# Patient Record
Sex: Male | Born: 1945 | Race: White | Hispanic: No | Marital: Married | Smoking: Former smoker
Health system: Southern US, Community
[De-identification: ages and names within clinical notes are randomized; demographics above are authoritative.]

## PROBLEM LIST (undated history)

## (undated) DIAGNOSIS — I219 Acute myocardial infarction, unspecified: Secondary | ICD-10-CM

## (undated) DIAGNOSIS — K2270 Barrett's esophagus without dysplasia: Secondary | ICD-10-CM

## (undated) DIAGNOSIS — K219 Gastro-esophageal reflux disease without esophagitis: Secondary | ICD-10-CM

## (undated) DIAGNOSIS — E119 Type 2 diabetes mellitus without complications: Secondary | ICD-10-CM

## (undated) DIAGNOSIS — I2510 Atherosclerotic heart disease of native coronary artery without angina pectoris: Secondary | ICD-10-CM

## (undated) DIAGNOSIS — E785 Hyperlipidemia, unspecified: Secondary | ICD-10-CM

## (undated) DIAGNOSIS — I10 Essential (primary) hypertension: Secondary | ICD-10-CM

## (undated) DIAGNOSIS — R7303 Prediabetes: Secondary | ICD-10-CM

## (undated) HISTORY — DX: Atherosclerotic heart disease of native coronary artery without angina pectoris: I25.10

## (undated) HISTORY — PX: ESOPHAGOGASTRODUODENOSCOPY: SHX1529

## (undated) HISTORY — DX: Essential (primary) hypertension: I10

## (undated) HISTORY — PX: COLONOSCOPY: SHX174

## (undated) HISTORY — PX: NO PAST SURGERIES: SHX2092

---

## 2007-09-20 ENCOUNTER — Ambulatory Visit: Payer: Self-pay | Admitting: Unknown Physician Specialty

## 2010-10-16 ENCOUNTER — Ambulatory Visit: Payer: Self-pay | Admitting: Internal Medicine

## 2017-12-22 ENCOUNTER — Encounter: Payer: Self-pay | Admitting: Emergency Medicine

## 2017-12-22 ENCOUNTER — Inpatient Hospital Stay
Admission: EM | Admit: 2017-12-22 | Discharge: 2017-12-23 | DRG: 287 | Disposition: A | Discharge status: Other Acute Inpatient Hospital | Payer: Medicare HMO | Attending: Internal Medicine | Admitting: Internal Medicine

## 2017-12-22 ENCOUNTER — Other Ambulatory Visit: Payer: Self-pay

## 2017-12-22 ENCOUNTER — Emergency Department: Payer: Medicare HMO

## 2017-12-22 ENCOUNTER — Encounter
Admission: EM | Disposition: A | Discharge status: Other Acute Inpatient Hospital | Payer: Self-pay | Source: Home / Self Care | Attending: Internal Medicine

## 2017-12-22 DIAGNOSIS — I249 Acute ischemic heart disease, unspecified: Secondary | ICD-10-CM | POA: Diagnosis present

## 2017-12-22 DIAGNOSIS — I25110 Atherosclerotic heart disease of native coronary artery with unstable angina pectoris: Secondary | ICD-10-CM | POA: Diagnosis present

## 2017-12-22 DIAGNOSIS — Z823 Family history of stroke: Secondary | ICD-10-CM | POA: Diagnosis not present

## 2017-12-22 DIAGNOSIS — I10 Essential (primary) hypertension: Secondary | ICD-10-CM | POA: Diagnosis present

## 2017-12-22 DIAGNOSIS — E669 Obesity, unspecified: Secondary | ICD-10-CM | POA: Diagnosis present

## 2017-12-22 DIAGNOSIS — E785 Hyperlipidemia, unspecified: Secondary | ICD-10-CM | POA: Diagnosis present

## 2017-12-22 DIAGNOSIS — R079 Chest pain, unspecified: Secondary | ICD-10-CM

## 2017-12-22 DIAGNOSIS — Z6828 Body mass index (BMI) 28.0-28.9, adult: Secondary | ICD-10-CM | POA: Diagnosis not present

## 2017-12-22 DIAGNOSIS — R7303 Prediabetes: Secondary | ICD-10-CM | POA: Diagnosis present

## 2017-12-22 DIAGNOSIS — Z8249 Family history of ischemic heart disease and other diseases of the circulatory system: Secondary | ICD-10-CM | POA: Diagnosis not present

## 2017-12-22 DIAGNOSIS — K219 Gastro-esophageal reflux disease without esophagitis: Secondary | ICD-10-CM | POA: Diagnosis present

## 2017-12-22 HISTORY — DX: Prediabetes: R73.03

## 2017-12-22 HISTORY — DX: Hyperlipidemia, unspecified: E78.5

## 2017-12-22 HISTORY — DX: Gastro-esophageal reflux disease without esophagitis: K21.9

## 2017-12-22 HISTORY — PX: LEFT HEART CATH AND CORONARY ANGIOGRAPHY: CATH118249

## 2017-12-22 LAB — CARDIAC CATHETERIZATION: CATHEFQUANT: 60 %

## 2017-12-22 LAB — GLUCOSE, CAPILLARY
GLUCOSE-CAPILLARY: 112 mg/dL — AB (ref 65–99)
Glucose-Capillary: 107 mg/dL — ABNORMAL HIGH (ref 65–99)

## 2017-12-22 LAB — CBC
HEMATOCRIT: 44.6 % (ref 40.0–52.0)
Hemoglobin: 15.2 g/dL (ref 13.0–18.0)
MCH: 29.3 pg (ref 26.0–34.0)
MCHC: 34 g/dL (ref 32.0–36.0)
MCV: 86.2 fL (ref 80.0–100.0)
Platelets: 168 10*3/uL (ref 150–440)
RBC: 5.18 MIL/uL (ref 4.40–5.90)
RDW: 13.1 % (ref 11.5–14.5)
WBC: 8.8 10*3/uL (ref 3.8–10.6)

## 2017-12-22 LAB — TROPONIN I
TROPONIN I: 0.1 ng/mL — AB (ref <0.03)
Troponin I: 0.07 ng/mL (ref <0.03)
Troponin I: 0.07 ng/mL (ref <0.03)

## 2017-12-22 LAB — BASIC METABOLIC PANEL
Anion gap: 9 (ref 5–15)
BUN: 26 mg/dL — AB (ref 6–20)
CO2: 25 mmol/L (ref 22–32)
Calcium: 8.9 mg/dL (ref 8.9–10.3)
Chloride: 104 mmol/L (ref 101–111)
Creatinine, Ser: 1 mg/dL (ref 0.61–1.24)
GFR calc Af Amer: >60 mL/min (ref >60)
GLUCOSE: 144 mg/dL — AB (ref 65–99)
POTASSIUM: 4.1 mmol/L (ref 3.5–5.1)
Sodium: 138 mmol/L (ref 135–145)

## 2017-12-22 LAB — APTT: APTT: 24 s (ref 24–36)

## 2017-12-22 LAB — HEMOGLOBIN A1C
HEMOGLOBIN A1C: 6.3 % — AB (ref 4.8–5.6)
MEAN PLASMA GLUCOSE: 134.11 mg/dL

## 2017-12-22 LAB — PROTIME-INR
INR: 0.99
Prothrombin Time: 13 seconds (ref 11.4–15.2)

## 2017-12-22 SURGERY — LEFT HEART CATH AND CORONARY ANGIOGRAPHY
Anesthesia: Moderate Sedation

## 2017-12-22 MED ORDER — HEPARIN (PORCINE) IN NACL 1000-0.9 UT/500ML-% IV SOLN
INTRAVENOUS | Status: AC
  Filled 2017-12-22: qty 1000

## 2017-12-22 MED ORDER — ONDANSETRON HCL 4 MG PO TABS: 4.0000 mg | ORAL_TABLET | Freq: Four times a day (QID) | ORAL | Status: DC | PRN

## 2017-12-22 MED ORDER — ASPIRIN 81 MG PO CHEW
324.0000 mg | CHEWABLE_TABLET | Freq: Once | ORAL | Status: DC
  Filled 2017-12-22: qty 4

## 2017-12-22 MED ORDER — ASPIRIN 81 MG PO CHEW: 324.0000 mg | CHEWABLE_TABLET | Freq: Once | ORAL | 0 refills | Status: AC

## 2017-12-22 MED ORDER — ACETAMINOPHEN 325 MG PO TABS
650.0000 mg | ORAL_TABLET | Freq: Four times a day (QID) | ORAL | Status: DC | PRN
  Administered 2017-12-22: 650 mg via ORAL
  Filled 2017-12-22: qty 2

## 2017-12-22 MED ORDER — LIDOCAINE HCL (PF) 1 % IJ SOLN
INTRAMUSCULAR | Status: AC
  Filled 2017-12-22: qty 30

## 2017-12-22 MED ORDER — SODIUM CHLORIDE 0.9% FLUSH: 3.0000 mL | Freq: Two times a day (BID) | INTRAVENOUS | Status: DC

## 2017-12-22 MED ORDER — ASPIRIN 81 MG PO CHEW: 81.0000 mg | CHEWABLE_TABLET | ORAL | Status: DC

## 2017-12-22 MED ORDER — FENTANYL CITRATE (PF) 100 MCG/2ML IJ SOLN
INTRAMUSCULAR | Status: AC
  Filled 2017-12-22: qty 2

## 2017-12-22 MED ORDER — ACETAMINOPHEN 650 MG RE SUPP: 650.0000 mg | Freq: Four times a day (QID) | RECTAL | 0 refills | Status: AC | PRN

## 2017-12-22 MED ORDER — ONDANSETRON HCL 4 MG/2ML IJ SOLN: 4.0000 mg | Freq: Four times a day (QID) | INTRAMUSCULAR | Status: DC | PRN

## 2017-12-22 MED ORDER — HEPARIN (PORCINE) IN NACL 100-0.45 UNIT/ML-% IJ SOLN
1100.0000 [IU]/h | INTRAMUSCULAR | Status: DC
  Administered 2017-12-22 (×2): 1100 [IU]/h via INTRAVENOUS
  Filled 2017-12-22: qty 250

## 2017-12-22 MED ORDER — ONDANSETRON HCL 4 MG/2ML IJ SOLN: 4.0000 mg | Freq: Four times a day (QID) | INTRAMUSCULAR | 0 refills | Status: AC | PRN

## 2017-12-22 MED ORDER — INSULIN ASPART 100 UNIT/ML ~~LOC~~ SOLN: 0.0000 [IU] | Freq: Every day | SUBCUTANEOUS | 11 refills | Status: AC

## 2017-12-22 MED ORDER — SODIUM CHLORIDE 0.9 % IV SOLN: 250.0000 mL | INTRAVENOUS | Status: DC | PRN

## 2017-12-22 MED ORDER — SODIUM CHLORIDE 0.9 % WEIGHT BASED INFUSION: 3.0000 mL/kg/h | INTRAVENOUS | Status: DC

## 2017-12-22 MED ORDER — HEPARIN BOLUS VIA INFUSION
4000.0000 [IU] | Freq: Once | INTRAVENOUS | Status: AC
  Administered 2017-12-22: 4000 [IU] via INTRAVENOUS
  Filled 2017-12-22: qty 4000

## 2017-12-22 MED ORDER — PANTOPRAZOLE SODIUM 40 MG PO TBEC
40.0000 mg | DELAYED_RELEASE_TABLET | Freq: Two times a day (BID) | ORAL | Status: DC
  Administered 2017-12-22: 40 mg via ORAL
  Filled 2017-12-22: qty 1

## 2017-12-22 MED ORDER — ATORVASTATIN CALCIUM 80 MG PO TABS: 80.0000 mg | ORAL_TABLET | Freq: Every day | ORAL | 0 refills | Status: AC

## 2017-12-22 MED ORDER — SODIUM CHLORIDE 0.9% FLUSH
3.0000 mL | Freq: Two times a day (BID) | INTRAVENOUS | Status: DC
  Administered 2017-12-22: 3 mL via INTRAVENOUS

## 2017-12-22 MED ORDER — INSULIN ASPART 100 UNIT/ML ~~LOC~~ SOLN: 0.0000 [IU] | Freq: Three times a day (TID) | SUBCUTANEOUS | Status: DC

## 2017-12-22 MED ORDER — IOPAMIDOL (ISOVUE-300) INJECTION 61%
INTRAVENOUS | Status: DC | PRN
  Administered 2017-12-22: 130 mL via INTRAVENOUS

## 2017-12-22 MED ORDER — INSULIN ASPART 100 UNIT/ML ~~LOC~~ SOLN: 0.0000 [IU] | Freq: Three times a day (TID) | SUBCUTANEOUS | 11 refills | Status: AC

## 2017-12-22 MED ORDER — NITROGLYCERIN 0.4 MG SL SUBL: 0.4000 mg | SUBLINGUAL_TABLET | SUBLINGUAL | 0 refills | Status: AC | PRN

## 2017-12-22 MED ORDER — SODIUM CHLORIDE 0.9% FLUSH: 3.0000 mL | INTRAVENOUS | Status: DC | PRN

## 2017-12-22 MED ORDER — ACETAMINOPHEN 650 MG RE SUPP: 650.0000 mg | Freq: Four times a day (QID) | RECTAL | Status: DC | PRN

## 2017-12-22 MED ORDER — NITROGLYCERIN 0.4 MG SL SUBL: 0.4000 mg | SUBLINGUAL_TABLET | SUBLINGUAL | Status: DC | PRN

## 2017-12-22 MED ORDER — ATORVASTATIN CALCIUM 80 MG PO TABS
80.0000 mg | ORAL_TABLET | Freq: Every day | ORAL | Status: DC
  Administered 2017-12-22: 80 mg via ORAL
  Filled 2017-12-22: qty 4
  Filled 2017-12-22: qty 1

## 2017-12-22 MED ORDER — SODIUM CHLORIDE 0.9 % WEIGHT BASED INFUSION: 1.0000 mL/kg/h | INTRAVENOUS | Status: DC

## 2017-12-22 MED ORDER — MIDAZOLAM HCL 2 MG/2ML IJ SOLN
INTRAMUSCULAR | Status: AC
  Filled 2017-12-22: qty 2

## 2017-12-22 MED ORDER — ASPIRIN EC 81 MG PO TBEC: 81.0000 mg | DELAYED_RELEASE_TABLET | Freq: Every day | ORAL | Status: DC

## 2017-12-22 MED ORDER — PANTOPRAZOLE SODIUM 40 MG PO TBEC: 40.0000 mg | DELAYED_RELEASE_TABLET | Freq: Two times a day (BID) | ORAL | 0 refills | Status: AC

## 2017-12-22 MED ORDER — HEPARIN (PORCINE) IN NACL 100-0.45 UNIT/ML-% IJ SOLN: 1100.0000 [IU]/h | INTRAMUSCULAR | 1 refills | Status: AC

## 2017-12-22 MED ORDER — MIDAZOLAM HCL 2 MG/2ML IJ SOLN
INTRAMUSCULAR | Status: DC | PRN
  Administered 2017-12-22: 1 mg via INTRAVENOUS

## 2017-12-22 MED ORDER — FENTANYL CITRATE (PF) 100 MCG/2ML IJ SOLN
INTRAMUSCULAR | Status: DC | PRN
  Administered 2017-12-22: 25 ug via INTRAVENOUS

## 2017-12-22 MED ORDER — INSULIN ASPART 100 UNIT/ML ~~LOC~~ SOLN: 0.0000 [IU] | Freq: Every day | SUBCUTANEOUS | Status: DC

## 2017-12-22 SURGICAL SUPPLY — 9 items
CATH INFINITI 5FR ANG PIGTAIL (CATHETERS) ×2 IMPLANT
CATH INFINITI 5FR JL4 (CATHETERS) ×2 IMPLANT
CATH INFINITI JR4 5F (CATHETERS) ×2 IMPLANT
DEVICE CLOSURE MYNXGRIP 5F (Vascular Products) ×2 IMPLANT
KIT MANI 3VAL PERCEP (MISCELLANEOUS) ×2 IMPLANT
NEEDLE PERC 18GX7CM (NEEDLE) ×2 IMPLANT
PACK CARDIAC CATH (CUSTOM PROCEDURE TRAY) ×2 IMPLANT
SHEATH AVANTI 5FR X 11CM (SHEATH) ×2 IMPLANT
WIRE GUIDERIGHT .035X150 (WIRE) ×2 IMPLANT

## 2017-12-22 NOTE — Progress Notes (Signed)
Spoke with MD Callwood in regards to restarting Heparin gtt. Per MD, heparin gtt needs to be restarted at the same continuous rate of 11 mL/hr without bolus 6 hours post cardiac catheterization. Per Chart, cardiac catheterization ended at 1647. Therefore I will resume heparin gtt at 2247. Pharmacist notified. Will continue to monitor.  Daniel Good

## 2017-12-22 NOTE — Progress Notes (Signed)
MD callwood called and stated that pt will be transfer to [facility]. Notify primary MD. MD weiting was notifed and on call MD will complete forms. No further orders at this time.

## 2017-12-22 NOTE — Progress Notes (Signed)
ANTICOAGULATION CONSULT NOTE - Follow Up Consult Heparin  Indication:  ACS/NSTEMI   No Known Allergies  Patient Measurements: Height:  (177.8 cm) Weight: 200 lb (90.7 kg) IBW/kg (Calculated) : 73 Heparin Dosing Weight: 90.7 kg  Vital Signs: Temp: 98.6 F (37 C) (05/28 1923) Temp Source: Oral (05/28 1923) BP: 144/91 (05/28 1923) Pulse Rate: 75 (05/28 1923)  Labs: Recent Labs    12/22/17 0504 12/22/17 0940 12/22/17 1326  HGB 15.2  --   --   HCT 44.6  --   --   PLT 168  --   --   APTT  --   --  24  LABPROT  --   --  13.0  INR  --   --  0.99  CREATININE 1.00  --   --   TROPONINI 0.07* 0.10* 0.07*    Estimated Creatinine Clearance: 77.9 mL/min (by C-G formula based on SCr of 1 mg/dL).   Medications:    Assessment:  Goal of Therapy:  HL : 0.3 - 0.7  Check platelets daily    Plan:  Heparin drip started on 5/28 AM but was held this afternoon for cardiac cath.  Heparin drip resumed on 5/28 @ 21:30  Will recheck HL on 5/28 @ 0600.   Lenae Wherley D 12/22/2017,9:37 PM

## 2017-12-22 NOTE — Progress Notes (Signed)
Patient admitted to room 250.

## 2017-12-22 NOTE — Consult Note (Signed)
Reason for Consult: Unstable angina Referring Physician: Dr. Emily Filbert primary Dr. Marquis Lunch hospitalist  Daniel Good is an 72 y.o. male.  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  Patient been seen by his primary physician underwent a exercise stress echo about 6 months ago and has been treated medically the patient started having almost daily chest discomfort and left arm discomfort she came to emergency room for evaluation and subsequently was admitted troponins were borderline elevated so he is being admitted for unstable angina and possible coronary artery disease  Past Medical History:  Diagnosis Date  . GERD (gastroesophageal reflux disease)   . Hyperlipidemia   . Pre-diabetes     Past Surgical History:  Procedure Laterality Date  . NO PAST SURGERIES      Family History  Problem Relation Age of Onset  . CAD Mother   . Heart failure Mother   . CVA Father     Social History:  reports that he has never smoked. He has never used smokeless tobacco. He reports that he drinks alcohol. He reports that he does not use drugs.  Allergies: No Known Allergies  Medications: I have reviewed the patient's current medications.  Results for orders placed or performed during the hospital encounter of 12/22/17 (from the past 48 hour(s))  Basic metabolic panel     Status: Abnormal   Collection Time: 12/22/17  5:04 AM  Result Value Ref Range   Sodium 138 135 - 145 mmol/L   Potassium 4.1 3.5 - 5.1 mmol/L   Chloride 104 101 - 111 mmol/L   CO2 25 22 - 32 mmol/L   Glucose, Bld 144 (H) 65 - 99 mg/dL   BUN 26 (H) 6 - 20 mg/dL   Creatinine, Ser 1.00 0.61 - 1.24 mg/dL   Calcium 8.9 8.9 - 10.3 mg/dL   GFR calc non Af Amer >60 >60 mL/min   GFR calc Af Amer >60 >60 mL/min    Comment:  (NOTE) The eGFR has been calculated using the CKD EPI equation. This calculation has not been validated in all clinical situations. eGFR's persistently <60 mL/min signify possible Chronic Kidney Disease.    Anion gap 9 5 - 15    Comment: Performed at [facility], [address] [place], [place] [address]  CBC     Status: None   Collection Time: 12/22/17  5:04 AM  Result Value Ref Range   WBC 8.8 3.8 - 10.6 K/uL   RBC 5.18 4.40 - 5.90 MIL/uL   Hemoglobin 15.2 13.0 - 18.0 g/dL   HCT 44.6 40.0 - 52.0 %   MCV 86.2 80.0 - 100.0 fL   MCH 29.3 26.0 - 34.0 pg   MCHC 34.0 32.0 - 36.0 g/dL   RDW 13.1 11.5 - 14.5 %   Platelets 168 150 - 440 K/uL    Comment: Performed at [facility], [address] [place], [place] [address]  Troponin I     Status: Abnormal   Collection Time: 12/22/17  5:04 AM  Result Value Ref Range   Troponin I 0.07 (HH) <0.03 ng/mL    Comment: CRITICAL RESULT CALLED TO, READ BACK BY AND VERIFIED WITH  [place] AT [phone] 12/22/17 SDR Performed at [facility], [address] [place], [place] [address]  Troponin I     Status: Abnormal   Collection Time: 12/22/17  9:40 AM  Result Value Ref Range   Troponin I 0.10 (HH) <0.03 ng/mL    Comment: CRITICAL VALUE NOTED. VALUE IS CONSISTENT WITH PREVIOUSLY REPORTED/CALLED VALUE  SNJ/SDR Performed at [facility], [address] [place], [place] [address]   Hemoglobin A1c     Status: Abnormal   Collection Time: 12/22/17  9:40 AM  Result Value Ref Range   Hgb A1c MFr Bld 6.3 (H) 4.8 - 5.6 %    Comment: (NOTE) Pre diabetes:          5.7%-6.4% Diabetes:              >6.4% Glycemic control for   <7.0% adults with diabetes    Mean Plasma Glucose 134.11 mg/dL    Comment: Performed at [facility] [address] [place], [place] [address]    Dg Chest 2 View  Result Date: 12/22/2017 CLINICAL DATA:  Patient woke at 3:30 a.m. with chest pain and diaphoresis. History of  hypertension. EXAM: CHEST - 2 VIEW COMPARISON:  None. FINDINGS: The heart size and mediastinal contours are within normal limits. Both lungs are clear. The visualized skeletal structures are unremarkable. IMPRESSION: No active cardiopulmonary disease. Electronically Signed   By: Lucienne Capers M.D.   On: 12/22/2017 05:27    Review of Systems  Constitutional: Positive for diaphoresis and malaise/fatigue.  HENT: Positive for congestion.   Eyes: Negative.   Respiratory: Positive for shortness of breath.   Cardiovascular: Positive for chest pain, palpitations, orthopnea and PND.  Gastrointestinal: Positive for heartburn.  Genitourinary: Negative.   Musculoskeletal: Positive for joint pain and myalgias.  Skin: Negative.   Neurological: Positive for dizziness.  Endo/Heme/Allergies: Negative.   Psychiatric/Behavioral: Negative.    Blood pressure (!) 150/79, pulse (!) 59, temperature 97.8 F (36.6 C), temperature source Oral, resp. rate 14, height '5\' 10"'$  (1.778 m), weight 200 lb (90.7 kg), SpO2 98 %. Physical Exam  Constitutional: He is oriented to person, place, and time. He appears well-developed and well-nourished.  HENT:  Head: Normocephalic and atraumatic.  Eyes: Pupils are equal, round, and reactive to light. Conjunctivae and EOM are normal.  Neck: Normal range of motion. Neck supple.  Cardiovascular: Normal rate and regular rhythm.  Murmur heard. Respiratory: Effort normal and breath sounds normal.  GI: Soft. Bowel sounds are normal.  Musculoskeletal: Normal range of motion.  Neurological: He is alert and oriented to person, place, and time. He has normal reflexes.  Skin: Skin is warm.    Assessment/Plan: Unstable angina Acute coronary syndrome Hypertension Obesity Chest pain GERD Hyperlipidemia Borderline troponins Borderline diabetes . Plan Agree with admit to telemetry Rule out for myocardial infarction follow-up EKGs and troponins Recommend IV anticoagulation with  heparin Aspirin therapy Consider low-dose beta-blockade as well as ACE inhibitor therapy Continue statin therapy for hyperlipidemia Consider invasive strategy with cardiac cath since he is recently had a functional study Recommend weight loss exercise portion control Continue omeprazole therapy for reflux symptoms   Nessie Nong D Jael Kostick 12/22/2017, 2:00 PM

## 2017-12-22 NOTE — ED Notes (Signed)
Pt is being admitted to 2A room 250. Report was given to Jerrye Bushy., RN and she verbalized understanding of the report given.

## 2017-12-22 NOTE — ED Notes (Signed)
Charge nurse notif pt's troponin 0.07; pt taken to room 9 by EDT Lashea to be placed on card monitor for further monitoring & eval

## 2017-12-22 NOTE — ED Notes (Signed)
Floor unable to take report at this time.

## 2017-12-22 NOTE — ED Notes (Signed)
ED Provider at bedside. 

## 2017-12-22 NOTE — Progress Notes (Signed)
   12/22/17 1900  Clinical Encounter Type  Visited With Patient;Patient and family together  Visit Type Initial;Other (Comment) (HCPOA)  Referral From Nurse  Spiritual Encounters  Spiritual Needs Emotional;Literature  HCPOA/AD materials dropped off with patient.  CH reviewed materials briefly, discussed process; offered additional spiritual and emotional support

## 2017-12-22 NOTE — H&P (Signed)
[facility] [place] [facility]   PATIENT NAME: Daniel Good    MR#:  [id number]  DATE OF BIRTH:  07-03-1946  DATE OF ADMISSION:  12/22/2017  PRIMARY CARE PHYSICIAN: Danella Penton, MD   REQUESTING/REFERRING PHYSICIAN: Dr Lucrezia Europe  CHIEF COMPLAINT:   Chief Complaint  Patient presents with  . Chest Pain    HISTORY OF PRESENT ILLNESS:  Daniel Good  is a 72 y.o. male with a known history of prediabetes, hyperlipidemia presents to the ER with chest pain.  This chest pain woke him up at 3:30 AM.  Severe in intensity, at least 6 out of 10 in intensity in the center of his chest.  It lasted 10 minutes then eased off.  No shortness of breath or nausea vomiting.  Did have pain in his left arm and jaw and sweating.  Over the last week he has been having on and off chest pain lasting a few minutes at a time.  Can happen at any time at rest or with exercise.  He did have a normal stress echo back in 2017 with Dr. Hyacinth Meeker.  Hospitalist services were contacted for further evaluation with first troponin borderline at 0.07.  PAST MEDICAL HISTORY:   Past Medical History:  Diagnosis Date  . GERD (gastroesophageal reflux disease)   . Hyperlipidemia   . Pre-diabetes     PAST SURGICAL HISTORY:   Past Surgical History:  Procedure Laterality Date  . NO PAST SURGERIES      SOCIAL HISTORY:   Social History   Tobacco Use  . Smoking status: Never Smoker  . Smokeless tobacco: Never Used  Substance Use Topics  . Alcohol use: Yes    Comment: 2-3 drinks per week    FAMILY HISTORY:   Family History  Problem Relation Age of Onset  . CAD Mother   . Heart failure Mother   . CVA Father     DRUG ALLERGIES:  No Known Allergies  REVIEW OF SYSTEMS:  CONSTITUTIONAL: No fever, fatigue or weakness.  Positive for sweating EYES: No blurred or double vision.  Wears glasses. EARS, NOSE, AND THROAT: No tinnitus or ear pain. No sore throat RESPIRATORY: No cough,  shortness of breath, wheezing or hemoptysis.  CARDIOVASCULAR: Positive no chest pain, orthopnea, edema.  GASTROINTESTINAL: No nausea, vomiting, diarrhea or abdominal pain. No blood in bowel movements GENITOURINARY: No dysuria, hematuria.  ENDOCRINE: No polyuria, nocturia,  HEMATOLOGY: No anemia, easy bruising or bleeding SKIN: No rash or lesion. MUSCULOSKELETAL: Left arm pain NEUROLOGIC: No tingling, numbness, weakness.  PSYCHIATRY: No anxiety or depression.   MEDICATIONS AT HOME:   Prior to Admission medications   Medication Sig Start Date End Date Taking? Authorizing Provider  atorvastatin (LIPITOR) 80 MG tablet Take 40 tablets by mouth daily.  08/18/16  Yes [provider]  omeprazole (PRILOSEC OTC) 20 MG tablet Take 1 capsule by mouth 2 (two) times daily. 04/22/17  Yes [provider]  aspirin EC 81 MG tablet Take 1 tablet by mouth daily.    [provider]      VITAL SIGNS:  Blood pressure 130/71, pulse (!) 58, temperature 98 F (36.7 C), temperature source Oral, resp. rate 12, height  (1.778 m), weight 90.7 kg (200 lb), SpO2 95 %.  PHYSICAL EXAMINATION:  GENERAL:  72 y.o.-year-old patient lying in the bed with no acute distress.  EYES: Pupils equal, round, reactive to light and accommodation. No scleral icterus. Extraocular muscles intact.  HEENT: Head atraumatic,  normocephalic. Oropharynx and nasopharynx clear.  NECK:  Supple, no jugular venous distention. No thyroid enlargement, no tenderness.  LUNGS: Normal breath sounds bilaterally, no wheezing, rales,rhonchi or crepitation. No use of accessory muscles of respiration.  CARDIOVASCULAR: S1, S2 normal. No murmurs, rubs, or gallops.  ABDOMEN: Soft, nontender, nondistended. Bowel sounds present. No organomegaly or mass.  EXTREMITIES: No pedal edema, cyanosis, or clubbing.  NEUROLOGIC: Cranial nerves II through XII are intact. Muscle strength 5/5 in all extremities. Sensation intact. Gait not  checked.  PSYCHIATRIC: The patient is alert and oriented x 3.  SKIN: No rash, lesion, or ulcer.   LABORATORY PANEL:   CBC Recent Labs  Lab 12/22/17 0504  WBC 8.8  HGB 15.2  HCT 44.6  PLT 168   ------------------------------------------------------------------------------------------------------------------  Chemistries  Recent Labs  Lab 12/22/17 0504  NA 138  K 4.1  CL 104  CO2 25  GLUCOSE 144*  BUN 26*  CREATININE 1.00  CALCIUM 8.9   ------------------------------------------------------------------------------------------------------------------  Cardiac Enzymes Recent Labs  Lab 12/22/17 0504  TROPONINI 0.07*   ------------------------------------------------------------------------------------------------------------------  RADIOLOGY:  Dg Chest 2 View  Result Date: 12/22/2017 CLINICAL DATA:  Patient woke at 3:30 a.m. with chest pain and diaphoresis. History of hypertension. EXAM: CHEST - 2 VIEW COMPARISON:  None. FINDINGS: The heart size and mediastinal contours are within normal limits. Both lungs are clear. The visualized skeletal structures are unremarkable. IMPRESSION: No active cardiopulmonary disease. Electronically Signed   By: Burman Nieves M.D.   On: 12/22/2017 05:27    EKG:   Sinus bradycardia 59 bpm no acute ST-T wave changes  IMPRESSION AND PLAN:   1.  Chest pain concerning for acute coronary syndrome.  The patient having chest pain on and off for the past week.  Worse episode this morning.  Aspirin, heparin drip and Lipitor.  Beta-blocker contraindicated with relative bradycardia at this point.  Cardiology consultation.  Serial cardiac enzymes and monitor on telemetry. 2.  Prediabetes check hemoglobin A1c put on sliding scale 3.  Hyperlipidemia unspecified put on Lipitor and check a lipid profile tomorrow morning 4.  GERD on PPI   All the records are reviewed and case discussed with ED provider. Management plans discussed with the patient,  family and they are in agreement.  CODE STATUS: Full code  TOTAL TIME TAKING CARE OF THIS PATIENT: 50 minutes.  Case discussed with Dr. Juliann Pares cardiology.   Alford Highland M.D on 12/22/2017 at 8:20 AM  Between 7am to 6pm - Pager - [phone]  After 6pm call admission pager [phone]  [facility]  [phone]  CC: Primary care physician; Danella Penton, MD

## 2017-12-22 NOTE — ED Notes (Signed)
Admitting Provider at bedside. 

## 2017-12-22 NOTE — ED Triage Notes (Signed)
Patient ambulatory to triage with steady gait, without difficulty or distress noted; pt reports earlier episode of CP (awoke at 330am diaphoretic) relieved by taking 2 ASA; pt denies any c/o at present but would like to be examined; st hx of same with HTN

## 2017-12-22 NOTE — ED Provider Notes (Signed)
[facility] Emergency Department Provider Note   ____________________________________________   First MD Initiated Contact with Patient 12/22/17 [phone]     (approximate)  I have reviewed the triage vital signs and the nursing notes.   HISTORY  Chief Complaint Chest Pain    HPI CHANDLOR NOECKER is a 72 y.o. male who comes into the hospital today with some chest pain.  He reports that it woke him up at 3:30 in the morning.  The patient states that it was mid chest intense pain in the center of his chest and going down his left arm.  The patient states that he was sweating but he denies any nausea vomiting or shortness of breath.  The patient has had similar pain previously.  He states that he had every day for 2 weeks and thought it was may be some nerve pain.  The patient took some aspirin and reports that his pain currently is a 0 out of 10 in intensity.  He is here for evaluation of this pain.  The patient states that he had a stress test about 3 to 6 months ago.   Past Medical History:  Diagnosis Date  . GERD (gastroesophageal reflux disease)   . Hyperlipidemia   . Pre-diabetes     There are no active problems to display for this patient.   History reviewed. No pertinent surgical history.  Prior to Admission medications   Not on File    Allergies Patient has no known allergies.  No family history on file.  Social History Social History   Tobacco Use  . Smoking status: Never Smoker  . Smokeless tobacco: Never Used  Substance Use Topics  . Alcohol use: Yes  . Drug use: Never    Review of Systems  Constitutional: sweats Eyes: No visual changes. ENT: No sore throat. Cardiovascular: chest pain. Respiratory: Denies shortness of breath. Gastrointestinal: No abdominal pain.  No nausea, no vomiting.   Genitourinary: Negative for dysuria. Musculoskeletal: Negative for back pain. Skin: Negative for rash. Neurological: Negative for  headaches.   ____________________________________________   PHYSICAL EXAM:  VITAL SIGNS: ED Triage Vitals  Enc Vitals Group     BP 12/22/17 0505 (!) 147/79     Pulse Rate 12/22/17 0505 63     Resp 12/22/17 0505 18     Temp 12/22/17 0505 98 F (36.7 C)     Temp Source 12/22/17 0505 Oral     SpO2 12/22/17 0505 95 %     Weight 12/22/17 0446 200 lb (90.7 kg)     Height 12/22/17 0446  (1.778 m)     Head Circumference --      Peak Flow --      Pain Score 12/22/17 0445 0     Pain Loc --      Pain Edu? --      Excl. in GC? --     Constitutional: Alert and oriented. Well appearing and in mild distress. Eyes: Conjunctivae are normal. PERRL. EOMI. Head: Atraumatic. Nose: No congestion/rhinnorhea. Mouth/Throat: Mucous membranes are moist.  Oropharynx non-erythematous. Cardiovascular: Normal rate, regular rhythm. Grossly normal heart sounds.  Good peripheral circulation. Respiratory: Normal respiratory effort.  No retractions. Lungs CTAB. Gastrointestinal: Soft and nontender. No distention. Positive bowel sounds Musculoskeletal: No lower extremity tenderness nor edema.   Neurologic:  Normal speech and language.  Skin:  Skin is warm, dry and intact.  Psychiatric: Mood and affect are normal.   ____________________________________________   LABS (all labs  ordered are listed, but only abnormal results are displayed)  Labs Reviewed  BASIC METABOLIC PANEL - Abnormal; Notable for the following components:      Result Value   Glucose, Bld 144 (*)    BUN 26 (*)    All other components within normal limits  TROPONIN I - Abnormal; Notable for the following components:   Troponin I 0.07 (*)    All other components within normal limits  CBC   ____________________________________________  EKG  ED ECG REPORT I, Rebecka Apley, the attending physician, personally viewed and interpreted this ECG.   Date: 12/22/2017  EKG Time: 0458  Rate: 59  Rhythm: Sinus bradycardia   Axis: normal  Intervals:none  ST&T Change: none  ____________________________________________  RADIOLOGY  ED MD interpretation: Chest x-ray: No active cardiopulmonary disease  Official radiology report(s): Dg Chest 2 View  Result Date: 12/22/2017 CLINICAL DATA:  Patient woke at 3:30 a.m. with chest pain and diaphoresis. History of hypertension. EXAM: CHEST - 2 VIEW COMPARISON:  None. FINDINGS: The heart size and mediastinal contours are within normal limits. Both lungs are clear. The visualized skeletal structures are unremarkable. IMPRESSION: No active cardiopulmonary disease. Electronically Signed   By: Burman Nieves M.D.   On: 12/22/2017 05:27    ____________________________________________   PROCEDURES  Procedure(s) performed: None  Procedures  Critical Care performed: No  ____________________________________________   INITIAL IMPRESSION / ASSESSMENT AND PLAN / ED COURSE  As part of my medical decision making, I reviewed the following data within the electronic MEDICAL RECORD NUMBER Notes from prior ED visits and [place] Controlled Substance Database   This is a 72 year old male who comes into the hospital today with some chest pain.  The pain woke him up out of sleep and it was in the middle of his chest with some radiation and some sweats.  My differential diagnosis includes acute coronary syndrome, pneumonia, musculoskeletal pain, pleural effusion  We did check some blood work on the patient to include a CBC BMP troponin and a chest x-ray.  The patient's pain is improved at this time.  He did take 2 aspirin at home.  The patient's blood work is unremarkable aside from a troponin of 0.07.  The patient's chest x-ray is also negative.  I will consider admitting the patient given his story as well as his troponin.  He will be reassessed.  I did contact Dr. Allena Katz who will admit the patient to the hospitalist service.       ____________________________________________   FINAL CLINICAL IMPRESSION(S) / ED DIAGNOSES  Final diagnoses:  Chest pain, unspecified type     ED Discharge Orders    None       Note:  This document was prepared using Dragon voice recognition software and may include unintentional dictation errors.    Rebecka Apley, MD 12/22/17 [phone]

## 2017-12-22 NOTE — Consult Note (Signed)
ANTICOAGULATION CONSULT NOTE - Initial Consult  Pharmacy Consult for heparin drip Indication: chest pain/ACS  No Known Allergies  Patient Measurements: Height:  (177.8 cm) Weight: 200 lb (90.7 kg) IBW/kg (Calculated) : 73 Heparin Dosing Weight: 90.7kg  Vital Signs: Temp: 97.8 F (36.6 C) (05/28 0931) Temp Source: Oral (05/28 0931) BP: 150/79 (05/28 0931) Pulse Rate: 59 (05/28 0931)  Labs: Recent Labs    12/22/17 0504  HGB 15.2  HCT 44.6  PLT 168  CREATININE 1.00  TROPONINI 0.07*    Estimated Creatinine Clearance: 77.9 mL/min (by C-G formula based on SCr of 1 mg/dL).   Medical History: Past Medical History:  Diagnosis Date  . GERD (gastroesophageal reflux disease)   . Hyperlipidemia   . Pre-diabetes     Medications:  Scheduled:  . aspirin  324 mg Oral Once  . [START ON 12/23/2017] aspirin EC  81 mg Oral Daily  . atorvastatin  80 mg Oral q1800  . heparin  4,000 Units Intravenous Once  . insulin aspart  0-5 Units Subcutaneous QHS  . insulin aspart  0-9 Units Subcutaneous TID WC  . pantoprazole  40 mg Oral BID    Assessment: Patient is a 72 year old male who presents to the ED with chest pain and slightly elevated troponin. Pt is not on any anticoagulation PTA. Pharmacy consulted to dose heparin drip. Baseline CBC WNL. Add on INR and APTT ordered.   Goal of Therapy:  Heparin level 0.3-0.7 units/ml Monitor platelets by anticoagulation protocol: Yes   Plan:  Give 4000 units bolus x 1 Start heparin infusion at 1100 units/hr Check anti-Xa level in 6 hours and daily while on heparin Continue to monitor H&H and platelets  Cynthia Stainback D Josearmando Kuhnert, Pharm.D, BCPS Clinical Pharmacist  12/22/2017,9:42 AM

## 2017-12-23 ENCOUNTER — Ambulatory Visit (HOSPITAL_COMMUNITY)
Admission: AD | Admit: 2017-12-23 | Discharge: 2017-12-23 | Disposition: A | Discharge status: Home / Self Care | Payer: Medicare HMO | Source: Other Acute Inpatient Hospital | Attending: Cardiology | Admitting: Cardiology

## 2017-12-23 ENCOUNTER — Encounter: Payer: Self-pay | Admitting: Internal Medicine

## 2017-12-23 DIAGNOSIS — I249 Acute ischemic heart disease, unspecified: Secondary | ICD-10-CM | POA: Insufficient documentation

## 2017-12-23 LAB — HIV ANTIBODY (ROUTINE TESTING W REFLEX): HIV Screen 4th Generation wRfx: NONREACTIVE

## 2017-12-23 NOTE — Discharge Summary (Signed)
[facility] - [place] at [facility]   PATIENT NAME: Daniel Good    MR#:  [id number]  DATE OF BIRTH:  [birth year]  DATE OF ADMISSION:  12/22/2017 ADMITTING PHYSICIAN: Alford Highland, MD  DATE OF DISCHARGE: 12/23/2017  2:00 AM  PRIMARY CARE PHYSICIAN: Danella Penton, MD    ADMISSION DIAGNOSIS:  Chest pain, unspecified type [R07.9]  DISCHARGE DIAGNOSIS:  Active Problems:   ACS (acute coronary syndrome) (HCC)   SECONDARY DIAGNOSIS:   Past Medical History:  Diagnosis Date  . GERD (gastroesophageal reflux disease)   . Hyperlipidemia   . Pre-diabetes     HOSPITAL COURSE:   1.  Chest pain and borderline troponin concerning for acute coronary syndrome.  The patient was admitted to the hospital.  He was given aspirin and heparin drip.  He was kept on his atorvastatin.  Relative bradycardia made beta-blocker contraindicated right at this point.  I contacted cardiology Dr. Juliann Pares who brought for cardiac catheterization.  Cardiac catheterization showed proximal RCA 70% stenosis and distal left main to ostial LAD 75% stenosis.  Troponins only stayed borderline peaking at 0.1.  Recommendations were to transfer to a tertiary care center For consideration of CABG.  Dr. Juliann Pares set up transfer to [facility] under the care of Dr. Jarold Motto. 2.  Prediabetes.  Hemoglobin A1c 6.3.  Patient was placed on sliding scale 3.  Hyperlipidemia unspecified.  The patient was put on the patient's Lipitor.  The patient was transferred to [facility] prior to a morning lipid profile being drawn. 4.  GERD on PPI  DISCHARGE CONDITIONS:   Fair  CONSULTS OBTAINED:  Treatment Team:  Alwyn Pea, MD  DRUG ALLERGIES:  No Known Allergies  DISCHARGE MEDICATIONS:   Allergies as of 12/23/2017   No Known Allergies     Medication List    STOP taking these medications   aspirin EC 81 MG tablet   PRILOSEC OTC 20 MG tablet Generic drug:  omeprazole Replaced by:  pantoprazole 40 MG tablet      TAKE these medications   acetaminophen 650 MG suppository Commonly known as:  TYLENOL Place 1 suppository (650 mg total) rectally every 6 (six) hours as needed for mild pain (or Fever >/= 101).   atorvastatin 80 MG tablet Commonly known as:  LIPITOR Take 1 tablet (80 mg total) by mouth daily at 6 PM. What changed:    how much to take  when to take this   heparin 100-0.45 UNIT/ML-% infusion Inject 1,100 Units/hr into the vein continuous.   insulin aspart 100 UNIT/ML injection Commonly known as:  novoLOG Inject 0-5 Units into the skin at bedtime.   insulin aspart 100 UNIT/ML injection Commonly known as:  novoLOG Inject 0-9 Units into the skin 3 (three) times daily with meals.   nitroGLYCERIN 0.4 MG SL tablet Commonly known as:  NITROSTAT Place 1 tablet (0.4 mg total) under the tongue every 5 (five) minutes as needed for chest pain.   ondansetron 4 MG/2ML Soln injection Commonly known as:  ZOFRAN Inject 2 mLs (4 mg total) into the vein every 6 (six) hours as needed for nausea.   pantoprazole 40 MG tablet Commonly known as:  PROTONIX Take 1 tablet (40 mg total) by mouth 2 (two) times daily. Replaces:  PRILOSEC OTC 20 MG tablet     ASK your doctor about these medications   aspirin 81 MG chewable tablet Chew 4 tablets (324 mg total) by mouth once for 1 dose. Ask about: Should I take this  medication?        DISCHARGE INSTRUCTIONS:   Follow-up with cardiac team at [facility]  If you experience worsening of your admission symptoms, develop shortness of breath, life threatening emergency, suicidal or homicidal thoughts you must seek medical attention immediately by calling 911 or calling your MD immediately  if symptoms less severe.  You Must read complete instructions/literature along with all the possible adverse reactions/side effects for all the Medicines you take and that have been prescribed to you. Take any new Medicines after you have completely understood and accept  all the possible adverse reactions/side effects.   Please note  You were cared for by a hospitalist during your hospital stay. If you have any questions about your discharge medications or the care you received while you were in the hospital after you are discharged, you can call the unit and asked to speak with the hospitalist on call if the hospitalist that took care of you is not available. Once you are discharged, your primary care physician will handle any further medical issues. Please note that NO REFILLS for any discharge medications will be authorized once you are discharged, as it is imperative that you return to your primary care physician (or establish a relationship with a primary care physician if you do not have one) for your aftercare needs so that they can reassess your need for medications and monitor your lab values.    Today   CHIEF COMPLAINT:   Chief Complaint  Patient presents with  . Chest Pain    HISTORY OF PRESENT ILLNESS:  Daniel Good  is a 72 y.o. male with a known history of prediabetes and hyperlipidemia.  He presented with chest pain.  VITAL SIGNS:  Blood pressure (!) 134/91, pulse (!) 58, temperature 98.5 F (36.9 C), temperature source Oral, resp. rate 16, height  (1.778 m), weight 90.7 kg (200 lb), SpO2 95 %.    PHYSICAL EXAMINATION:  GENERAL:  72 y.o.-year-old patient lying in the bed with no acute distress.  EYES: Pupils equal, round, reactive to light and accommodation. No scleral icterus. Extraocular muscles intact.  HEENT: Head atraumatic, normocephalic. Oropharynx and nasopharynx clear.  NECK:  Supple, no jugular venous distention. No thyroid enlargement, no tenderness.  LUNGS: Normal breath sounds bilaterally, no wheezing, rales,rhonchi or crepitation. No use of accessory muscles of respiration.  CARDIOVASCULAR: S1, S2 normal. No murmurs, rubs, or gallops.  ABDOMEN: Soft, non-tender, non-distended. Bowel sounds present. No organomegaly  or mass.  EXTREMITIES: No pedal edema, cyanosis, or clubbing.  NEUROLOGIC: Cranial nerves II through XII are intact. Muscle strength 5/5 in all extremities. Sensation intact. Gait not checked.  PSYCHIATRIC: The patient is alert and oriented x 3.  SKIN: No obvious rash, lesion, or ulcer.   DATA REVIEW:   CBC Recent Labs  Lab 12/22/17 0504  WBC 8.8  HGB 15.2  HCT 44.6  PLT 168    Chemistries  Recent Labs  Lab 12/22/17 0504  NA 138  K 4.1  CL 104  CO2 25  GLUCOSE 144*  BUN 26*  CREATININE 1.00  CALCIUM 8.9    Cardiac Enzymes Recent Labs  Lab 12/22/17 1326  TROPONINI 0.07*     RADIOLOGY:  Dg Chest 2 View  Result Date: 12/22/2017 CLINICAL DATA:  Patient woke at 3:30 a.m. with chest pain and diaphoresis. History of hypertension. EXAM: CHEST - 2 VIEW COMPARISON:  None. FINDINGS: The heart size and mediastinal contours are within normal limits. Both lungs are clear. The visualized  skeletal structures are unremarkable. IMPRESSION: No active cardiopulmonary disease. Electronically Signed   By: Burman Nieves M.D.   On: 12/22/2017 05:27    Management plans discussed with the patient, and he is in agreement.  CODE STATUS:  Code Status History    Date Active Date Inactive Code Status Order ID Comments User Context   12/22/2017 0817 12/23/2017 0526 Full Code [id number]  Alford Highland, MD ED       Alford Highland M.D on 12/23/2017 at 11:52 AM  Between 7am to 6pm - Pager - [phone]  After 6pm go to [email or web address] - password [email or web address]  [facility] Office  [phone]  CC: Primary care physician; Danella Penton, MD

## 2017-12-23 NOTE — Progress Notes (Addendum)
0127:Report given to [employer], as well as Zella Ball, Charity fundraiser at [employer]. Patient updated. This RN offered to call his family, however patient refused stating he would like "to allow her to get some sleep". Awaiting transport arrival.   Update 0154: Patient transfer to room 7201 via [facility] personnel. VSS, No distress noted at time of discharge. Heparin gtt infusing at 11 mL/hr. Belongings sent with patient. All questions answered. No concerns at this time.   Mayra Neer M

## 2018-05-10 ENCOUNTER — Encounter: Payer: Self-pay | Admitting: Dietician

## 2018-05-10 ENCOUNTER — Encounter: Payer: Medicare HMO | Attending: Internal Medicine | Admitting: Dietician

## 2018-05-10 VITALS — BP 128/82 | Ht 70.0 in | Wt 196.9 lb

## 2018-05-10 DIAGNOSIS — E119 Type 2 diabetes mellitus without complications: Secondary | ICD-10-CM | POA: Diagnosis present

## 2018-05-10 DIAGNOSIS — Z713 Dietary counseling and surveillance: Secondary | ICD-10-CM | POA: Insufficient documentation

## 2018-05-10 NOTE — Patient Instructions (Addendum)
  Check blood sugars 2 x day before breakfast and 2 hrs after supper every day and record Bring blood sugar records to the next appointment/class Exercise:  Try walking 10-15 min 3x/wk Eat 3 meals day and  2 snacks a day in afternoon and at bedtime Eat 3 carbohydrate servings/meal + protein Eat 1 carbohydrate serving/snack + protein Space meals 4-6 hours apart Avoid sugar sweetened drinks (soda, tea, coffee, sports drinks, juices) Limit intake of sweets, fried foods and snack foods Drink plenty of water Make a dentist  appointment Get a [employer] container Return for appointment/classes on:  05-31-18

## 2018-05-10 NOTE — Progress Notes (Signed)
Diabetes Self-Management Education  Visit Type: First/Initial  Appt. Start Time: 1600 Appt. End Time: 1700  05/10/2018  Daniel Good, identified by name and date of birth, is a 72 y.o. male with a diagnosis of Diabetes: Type 2.   ASSESSMENT  Blood pressure 128/82, height 5\' 10"  (1.778 m), weight 196 lb 14.4 oz (89.3 kg). Body mass index is 28.25 kg/m.  Diabetes Self-Management Education - 05/10/18 1705      Visit Information   Visit Type  First/Initial      Initial Visit   Diabetes Type  Type 2      Health Coping   How would you rate your overall health?  Good      Psychosocial Assessment   Patient Belief/Attitude about Diabetes  Motivated to manage diabetes    Self-care barriers  None    Self-management support  Doctor's office;Family    Other persons present  Spouse/SO;Patient    Patient Concerns  Weight Control;Glycemic Control;Healthy Lifestyle   prevent complications   Special Needs  None    Preferred Learning Style  Auditory    Learning Readiness  Ready    What is the last grade level you completed in school?  13      Pre-Education Assessment   Patient understands the diabetes disease and treatment process.  Needs Instruction    Patient understands incorporating nutritional management into lifestyle.  Needs Instruction    Patient undertands incorporating physical activity into lifestyle.  Needs Instruction    Patient understands using medications safely.  Needs Instruction    Patient understands monitoring blood glucose, interpreting and using results  Needs Review    Patient understands prevention, detection, and treatment of acute complications.  Needs Instruction    Patient understands prevention, detection, and treatment of chronic complications.  Needs Instruction    Patient understands how to develop strategies to address psychosocial issues.  Needs Instruction    Patient understands how to develop strategies to promote health/change behavior.  Needs  Instruction      Complications   Last HgB A1C per patient/outside source  6.7 %   04-02-18   How often do you check your blood sugar?  1-2 times/day    Have you had a dilated eye exam in the past 12 months?  Yes   07-2017   Have you had a dental exam in the past 12 months?  No   2 years ago   Are you checking your feet?  No      Dietary Intake   Breakfast  eats breakfast at 6:30a    Snack (morning)  none    Lunch  eats lunch at 12p-eats fried foods and sweets 2-3x/wk    Snack (afternoon)  none    Dinner  eats supper at 7p    Snack (evening)  eats chips or crackers at 9p    Beverage(s)  drinks water 2-3x/day and sweetened coffee 2-3x/day      Exercise   Exercise Type  ADL's      Patient Education   Previous Diabetes Education  No    Disease state   Explored patient's options for treatment of their diabetes;Definition of diabetes, type 1 and 2, and the diagnosis of diabetes    Nutrition management   Role of diet in the treatment of diabetes and the relationship between the three main macronutrients and blood glucose level;Food label reading, portion sizes and measuring food.;Carbohydrate counting    Physical activity and exercise   Role of  exercise on diabetes management, blood pressure control and cardiac health.;Helped patient identify appropriate exercises in relation to his/her diabetes, diabetes complications and other health issue.    Monitoring  Purpose and frequency of SMBG.;Taught/discussed recording of test results and interpretation of SMBG.;Identified appropriate SMBG and/or A1C goals.;Yearly dilated eye exam    Chronic complications  Relationship between chronic complications and blood glucose control;Dental care;Reviewed with patient heart disease, higher risk of, and prevention;Retinopathy and reason for yearly dilated eye exams;Lipid levels, blood glucose control and heart disease    Personal strategies to promote health  Lifestyle issues that need to be addressed for better  diabetes care;Helped patient develop diabetes management plan for (enter comment)      Outcomes   Expected Outcomes  Demonstrated interest in learning. Expect positive outcomes       Individualized Plan for Diabetes Self-Management Training:   Learning Objective:  Patient will have a greater understanding of diabetes self-management. Patient education plan is to attend individual and/or group sessions per assessed needs and concerns.   Plan:   Patient Instructions   Check blood sugars 2 x day before breakfast and 2 hrs after supper every day and record Bring blood sugar records to the next appointment/class Exercise:  Try walking 10-15 min 3x/wk Eat 3 meals day and  2 snacks a day in afternoon and at bedtime Eat 3 carbohydrate servings/meal + protein Eat 1 carbohydrate serving/snack + protein Space meals 4-6 hours apart Avoid sugar sweetened drinks (soda, tea, coffee, sports drinks, juices) Limit intake of sweets, fried foods and snack foods Drink plenty of water Make a dentist  appointment Get a [employer]ps container Return for appointment/classes on:  05-31-18   Expected Outcomes:  Demonstrated interest in learning. Expect positive outcomes  Education material provided: General meal planning guidelines, [employer] handout  If problems or questions, patient to contact team via:  [phone]  Future DSME appointment:  05-31-18

## 2018-05-31 ENCOUNTER — Ambulatory Visit: Payer: Medicare HMO

## 2018-06-01 ENCOUNTER — Telehealth: Payer: Self-pay | Admitting: Dietician

## 2018-06-01 NOTE — Telephone Encounter (Signed)
Called patient to reschedule class series, as he missed class 1 yesterday, 05/31/18. Left a voicemail message offering next available class series, and requested a call back.

## 2018-06-04 ENCOUNTER — Telehealth: Payer: Self-pay | Admitting: Dietician

## 2018-06-04 NOTE — Telephone Encounter (Signed)
Ms. Stallbaumer called to reschedule patient's class series to January 2020 due to other conflicts in December. Rescheduled him for class series beginning 08/02/2018.

## 2018-06-07 ENCOUNTER — Ambulatory Visit: Payer: Medicare HMO

## 2018-06-14 ENCOUNTER — Ambulatory Visit: Payer: Medicare HMO

## 2018-08-02 ENCOUNTER — Encounter: Payer: Self-pay | Admitting: Dietician

## 2018-08-02 ENCOUNTER — Encounter: Payer: Medicare HMO | Attending: Internal Medicine

## 2018-08-02 DIAGNOSIS — E119 Type 2 diabetes mellitus without complications: Secondary | ICD-10-CM | POA: Insufficient documentation

## 2018-08-02 DIAGNOSIS — Z713 Dietary counseling and surveillance: Secondary | ICD-10-CM | POA: Insufficient documentation

## 2018-08-02 NOTE — Progress Notes (Signed)
Patient did not come to class 1 today; this is his second missed class series.

## 2018-08-03 ENCOUNTER — Encounter: Payer: Self-pay | Admitting: Dietician

## 2019-05-19 IMAGING — CR DG CHEST 2V
2 series · 2 of 2 positions shown · non-contrast
Comparison: None.

CLINICAL DATA: Patient woke at [DATE] a.m. with chest pain and
diaphoresis. History of hypertension.

EXAM:
CHEST - 2 VIEW

[chest pa]
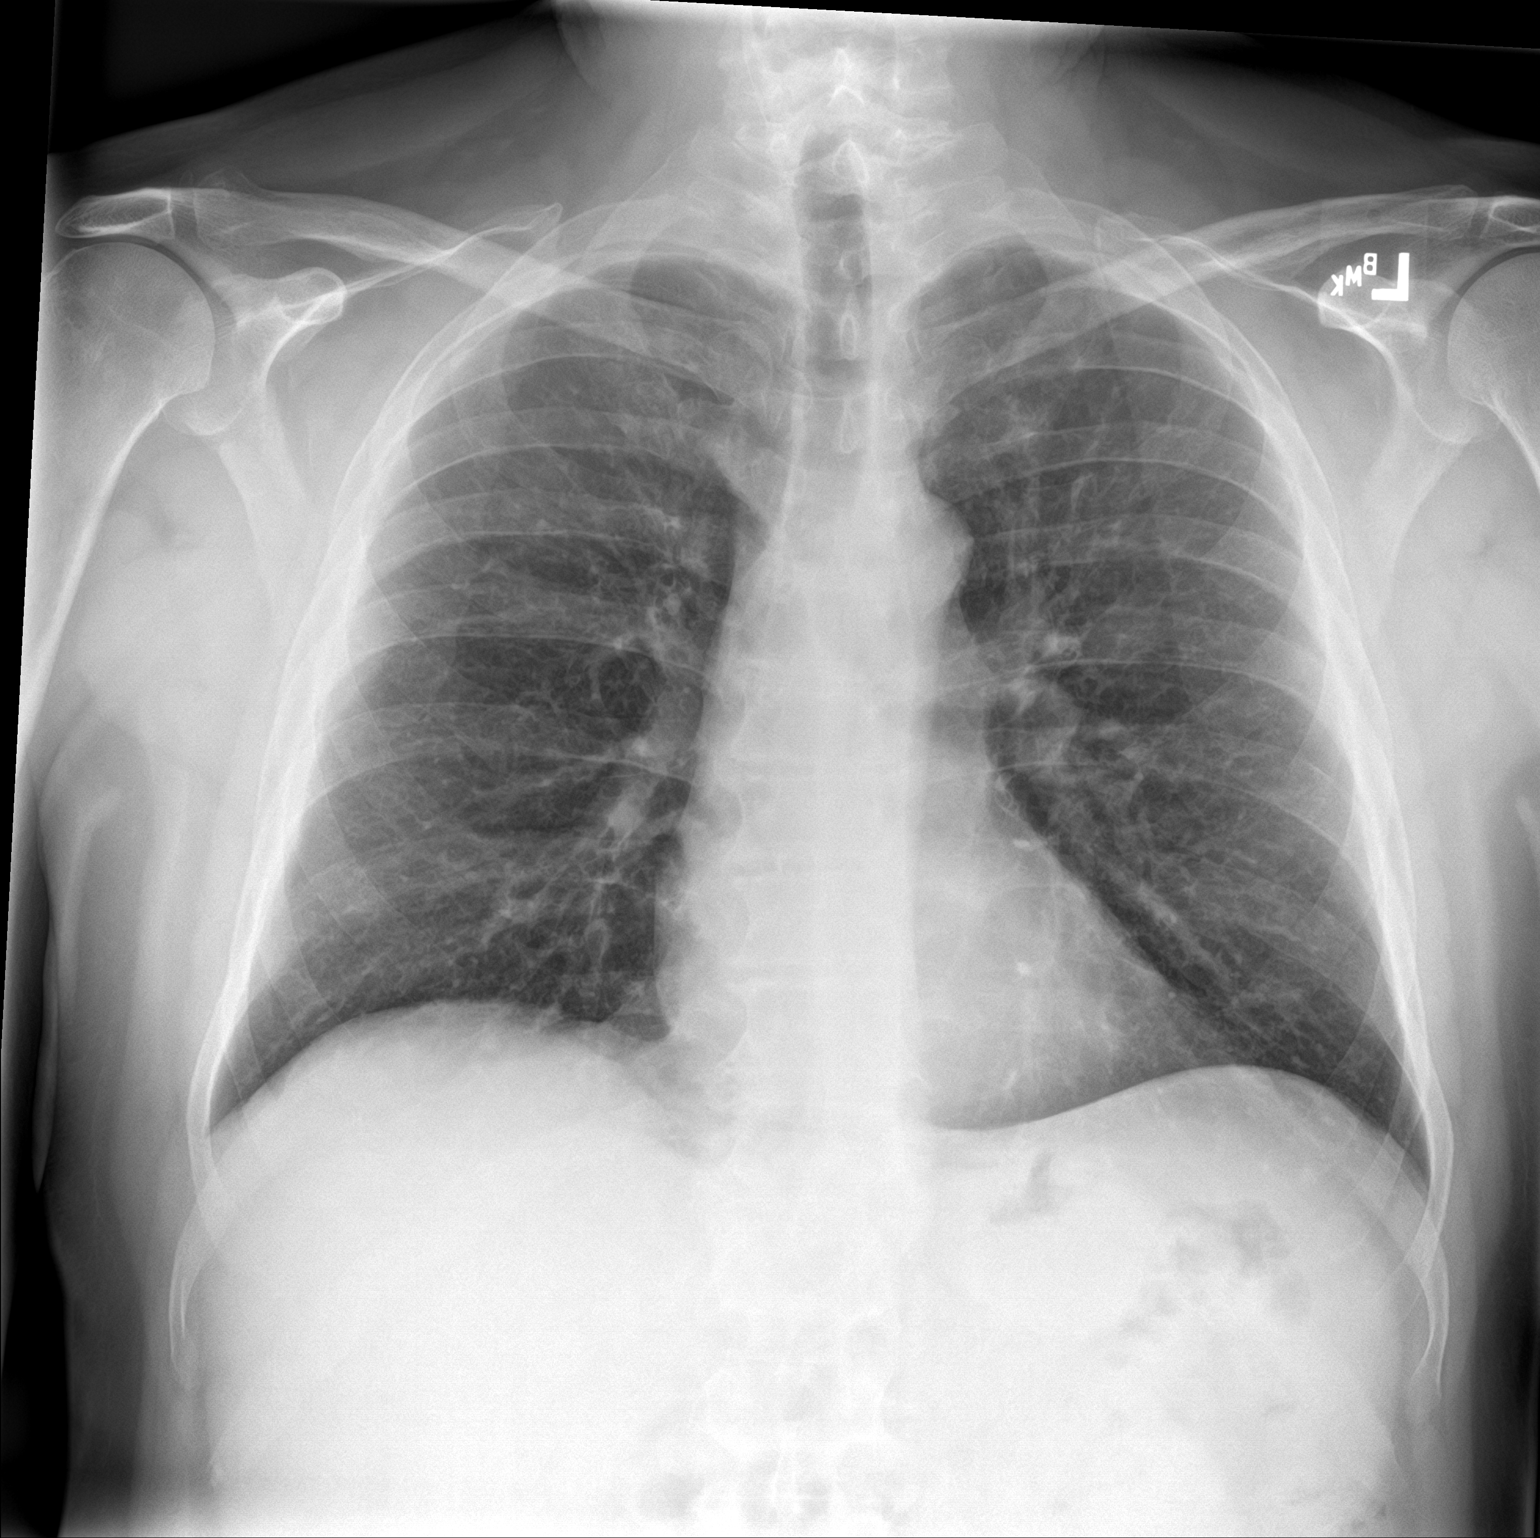

[chest lat]
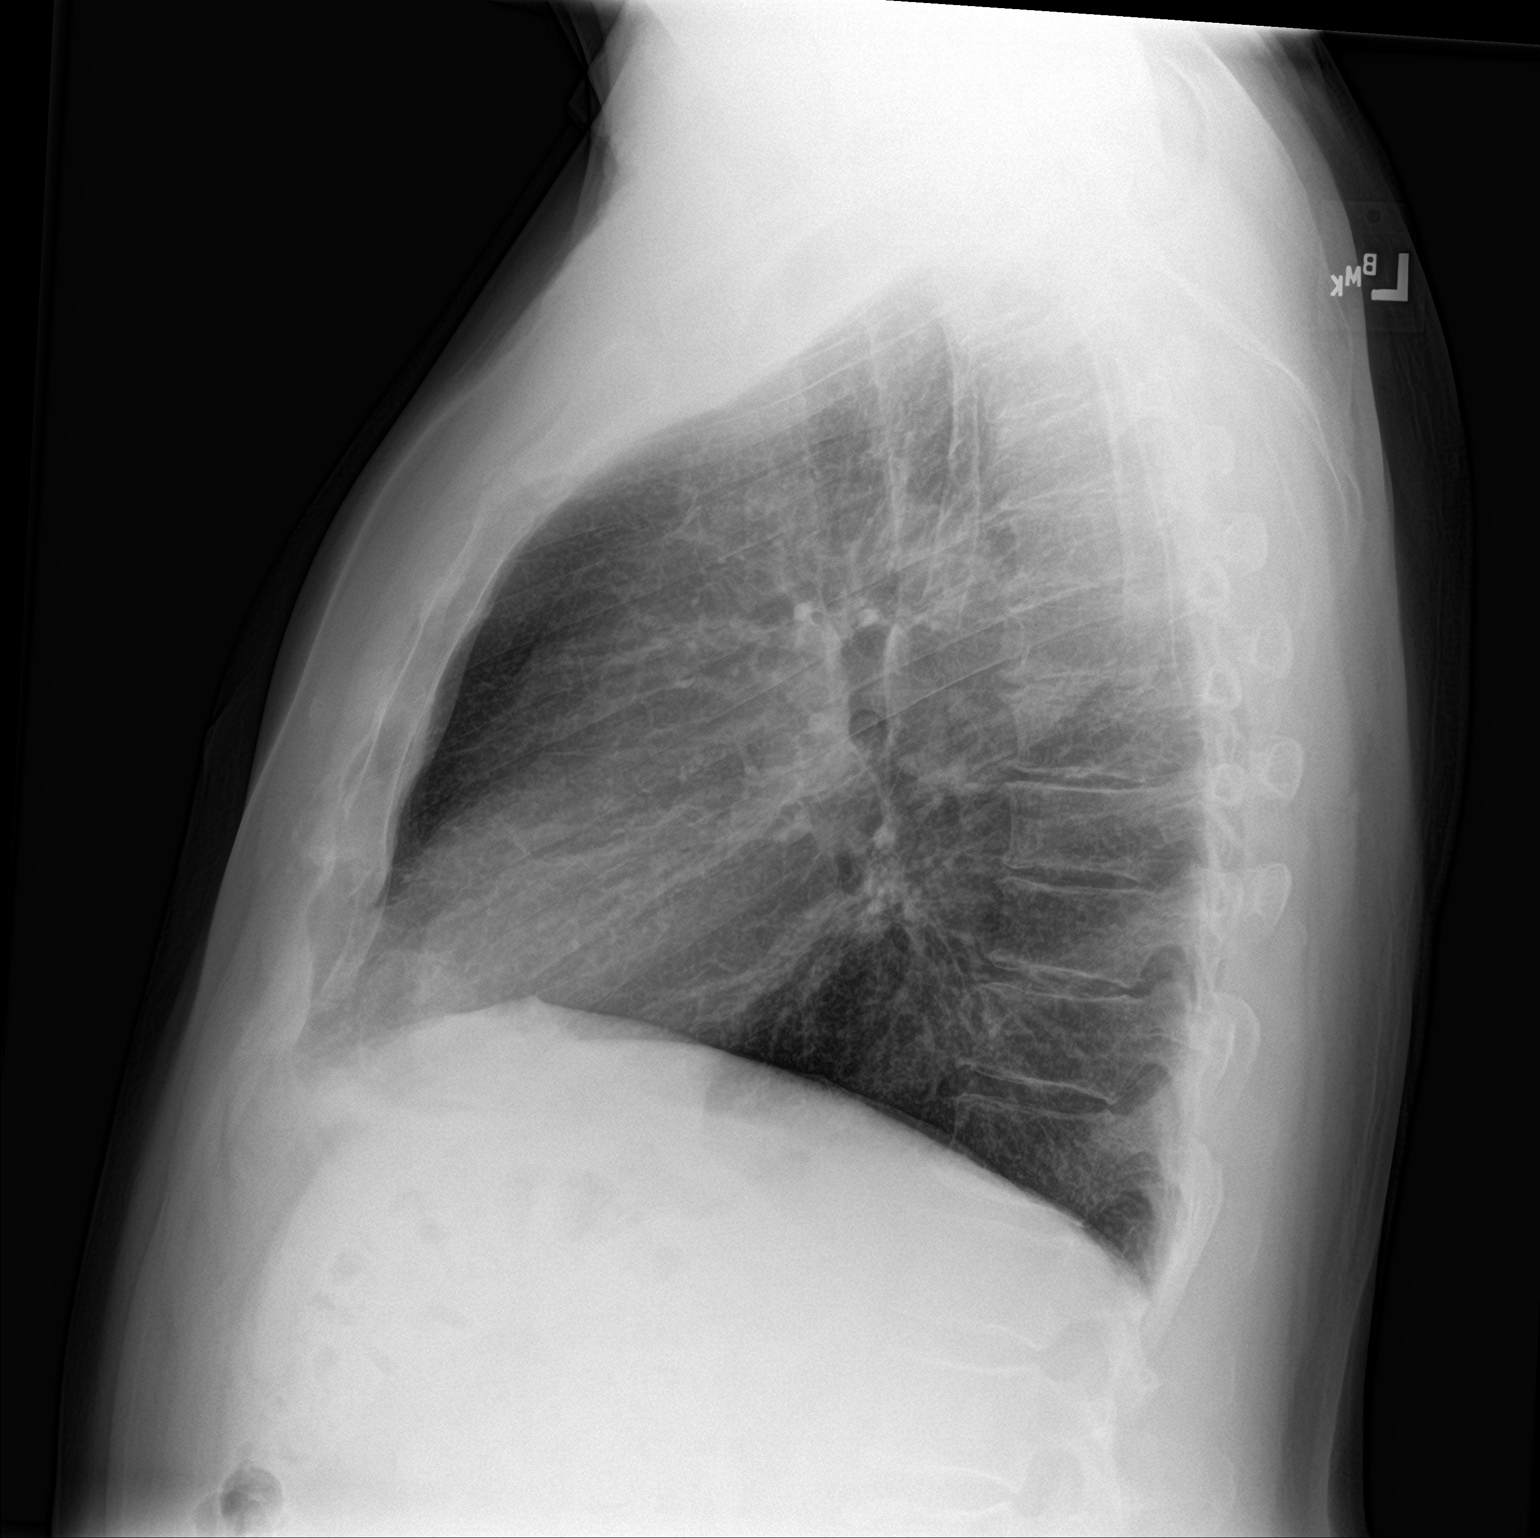

[2 of 2 positions shown; findings below may reference images not displayed]

FINDINGS: The heart size and mediastinal contours are within normal limits.
Both lungs are clear. The visualized skeletal structures are
unremarkable.
IMPRESSION: No active cardiopulmonary disease.

## 2019-09-17 ENCOUNTER — Ambulatory Visit: Payer: Medicare HMO | Attending: Internal Medicine

## 2019-09-17 DIAGNOSIS — Z23 Encounter for immunization: Secondary | ICD-10-CM | POA: Insufficient documentation

## 2019-09-17 NOTE — Progress Notes (Signed)
   Covid-19 Vaccination Clinic  Name:  Daniel Good    MRN: [id number] DOB: 08/02/1945  09/17/2019  Mr. Daniel Good was observed post Covid-19 immunization for 15 minutes without incidence. He was provided with Vaccine Information Sheet and instruction to access the V-Safe system.   Mr. Daniel Good was instructed to call 911 with any severe reactions post vaccine: Marland Kitchen Difficulty breathing  . Swelling of your face and throat  . A fast heartbeat  . A bad rash all over your body  . Dizziness and weakness    Immunizations Administered    Name Date Dose VIS Date Route   Pfizer COVID-19 Vaccine 09/17/2019  1:33 PM 0.3 mL 07/08/2019 Intramuscular   Manufacturer: [employer], [employer]   Lot: [id number]   NDC: [address]-3

## 2019-10-07 ENCOUNTER — Ambulatory Visit
Admission: RE | Admit: 2019-10-07 | Discharge: 2019-10-07 | Disposition: A | Discharge status: Home / Self Care | Payer: Medicare HMO | Source: Ambulatory Visit | Attending: Internal Medicine | Admitting: Internal Medicine

## 2019-10-07 ENCOUNTER — Other Ambulatory Visit: Payer: Self-pay | Admitting: Internal Medicine

## 2019-10-07 ENCOUNTER — Other Ambulatory Visit: Payer: Self-pay

## 2019-10-07 DIAGNOSIS — R1012 Left upper quadrant pain: Secondary | ICD-10-CM | POA: Diagnosis not present

## 2019-10-07 HISTORY — DX: Type 2 diabetes mellitus without complications: E11.9

## 2019-10-07 MED ORDER — IOHEXOL 300 MG/ML  SOLN
75.0000 mL | Freq: Once | INTRAMUSCULAR | Status: AC | PRN
  Administered 2019-10-07: 75 mL via INTRAVENOUS

## 2019-10-12 ENCOUNTER — Ambulatory Visit: Payer: Medicare HMO | Attending: Internal Medicine

## 2019-10-12 DIAGNOSIS — Z23 Encounter for immunization: Secondary | ICD-10-CM

## 2019-10-12 NOTE — Progress Notes (Signed)
   Covid-19 Vaccination Clinic  Name:  Daniel Good    MRN: [id number] DOB: 10/07/1945  10/12/2019  [name] was observed post Covid-19 immunization for 15 minutes without incident. He was provided with Vaccine Information Sheet and instruction to access the V-Safe system.   [name] was instructed to call 911 with any severe reactions post vaccine: Marland Kitchen Difficulty breathing  . Swelling of face and throat  . A fast heartbeat  . A bad rash all over body  . Dizziness and weakness   Immunizations Administered    Name Date Dose VIS Date Route   Pfizer COVID-19 Vaccine 10/12/2019 10:45 AM 0.3 mL 07/08/2019 Intramuscular   Manufacturer: [employer], [employer]   Lot: [id number]   NDC: [address]-8

## 2021-03-03 IMAGING — CT CT ABD-PELV W/ CM
1 of 3 series · 14 of 32 positions shown, 19 images · IV contrast (APPLIED)
Comparison: None.

CLINICAL DATA: Acute left-sided abdominal pain.

EXAM:
CT ABDOMEN AND PELVIS WITH CONTRAST
TECHNIQUE: Multidetector CT imaging of the abdomen and pelvis was performed
using the standard protocol following bolus administration of
intravenous contrast.
CONTRAST:  75mL OMNIPAQUE IOHEXOL 300 MG/ML  SOLN

[Series 2: axial st · axial · 0.77mm/px · z∈[-973,-528]mm · 14 of 101 slices shown, 19 images]
[im 6/101  soft-tissue]
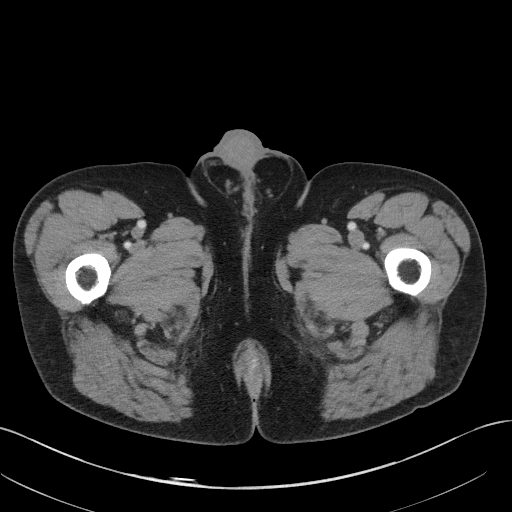
[im 6/101  bone]
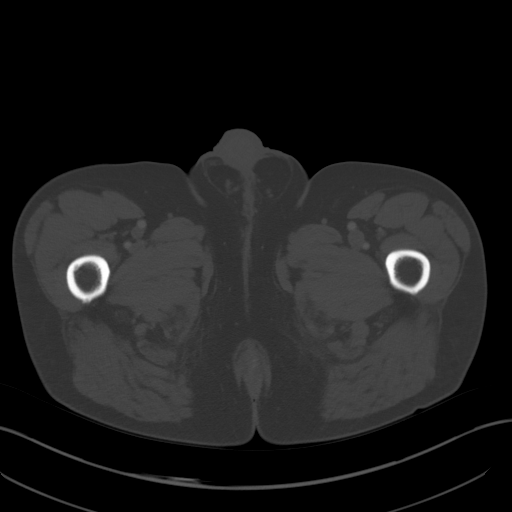
[im 16/101  soft-tissue]
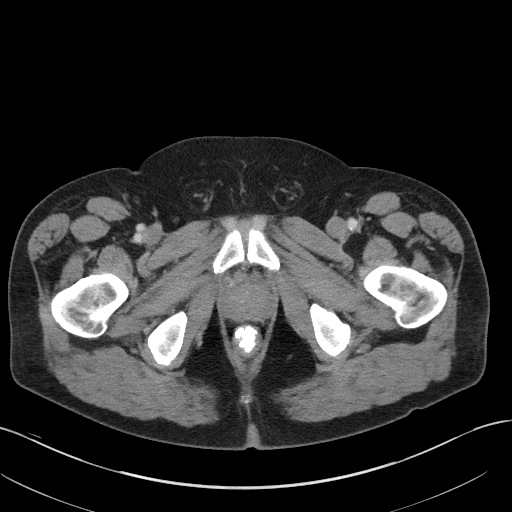
[im 22/101  soft-tissue]
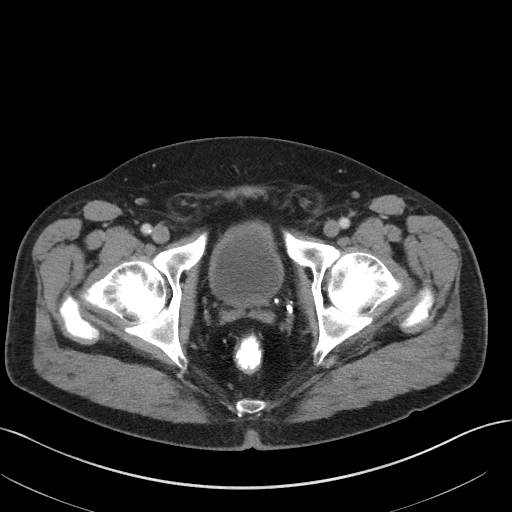
[im 27/101  soft-tissue]
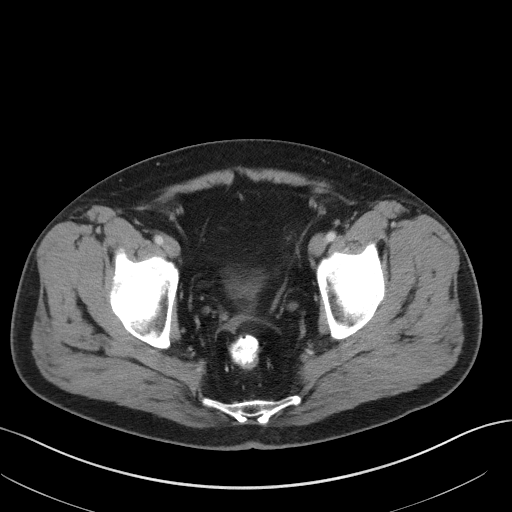
[im 37/101  soft-tissue]
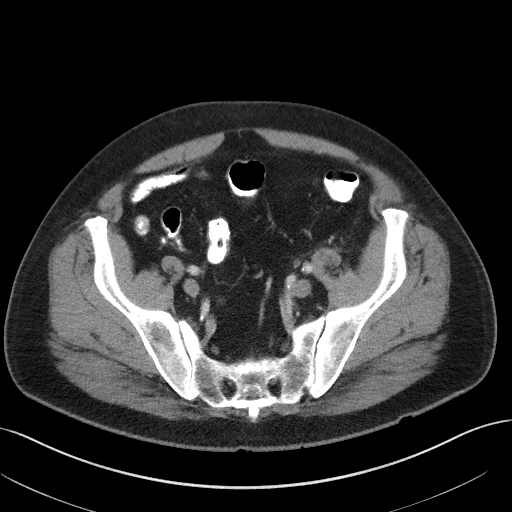
[im 43/101  soft-tissue]
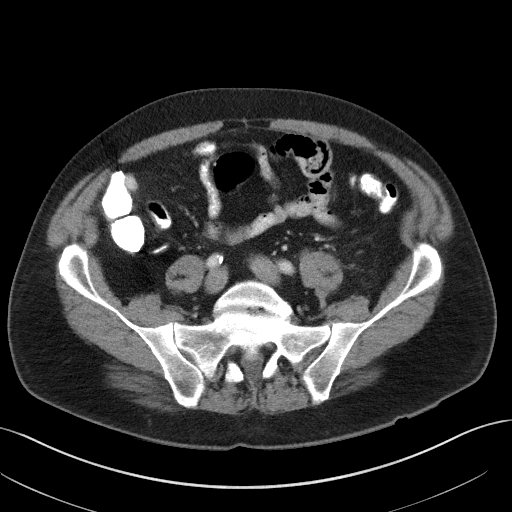
[im 53/101  soft-tissue]
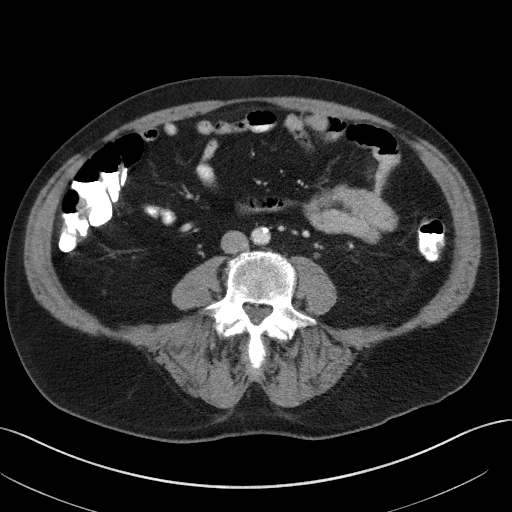
[im 58/101  soft-tissue]
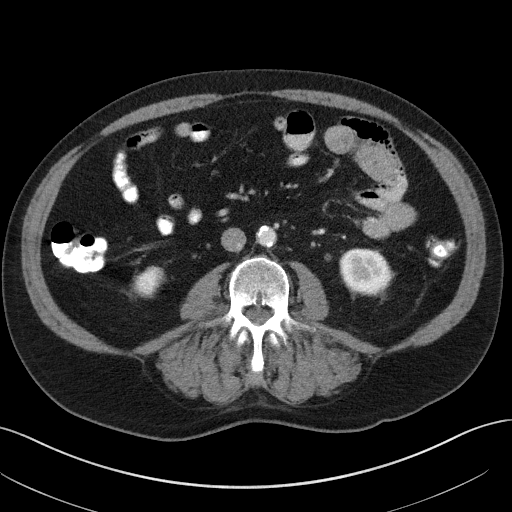
[im 64/101  soft-tissue]
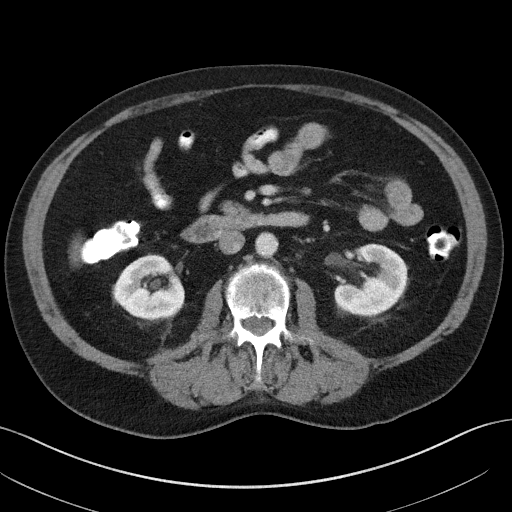
[im 64/101  bone]
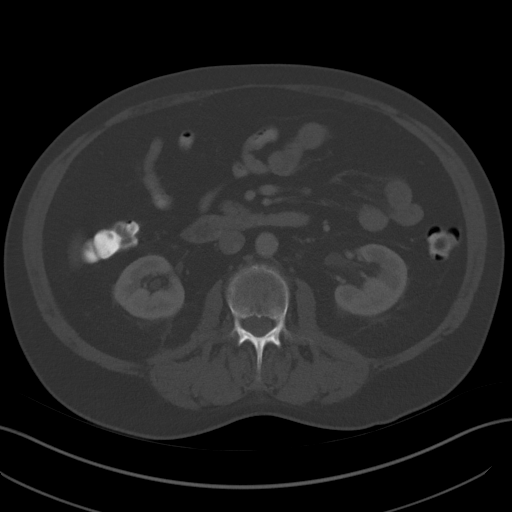
[im 74/101  soft-tissue]
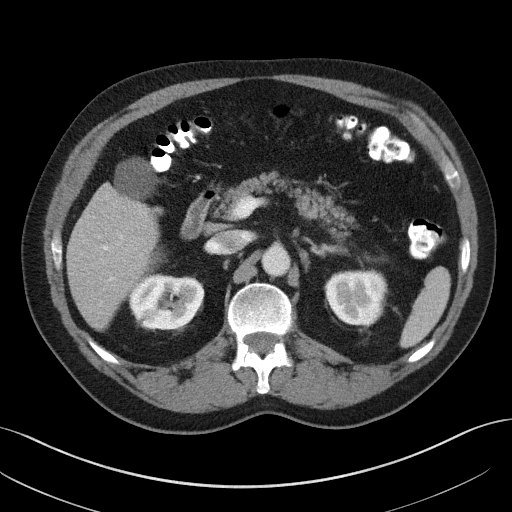
[im 79/101  soft-tissue]
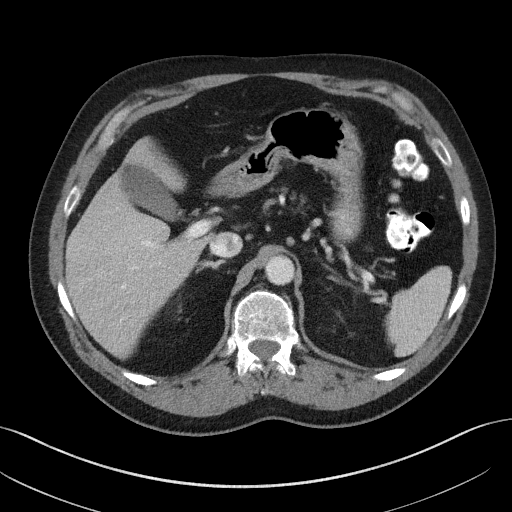
[im 79/101  lung]
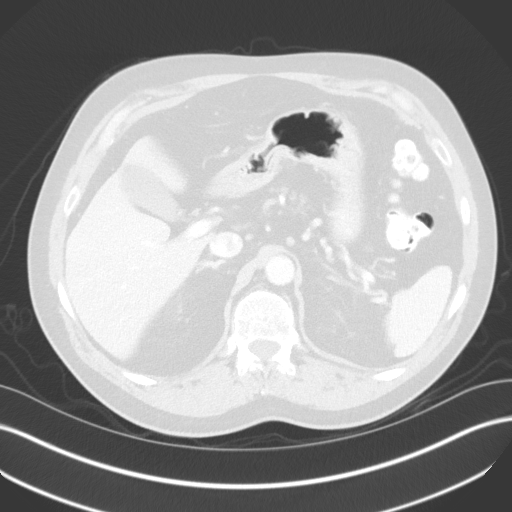
[im 85/101  soft-tissue]
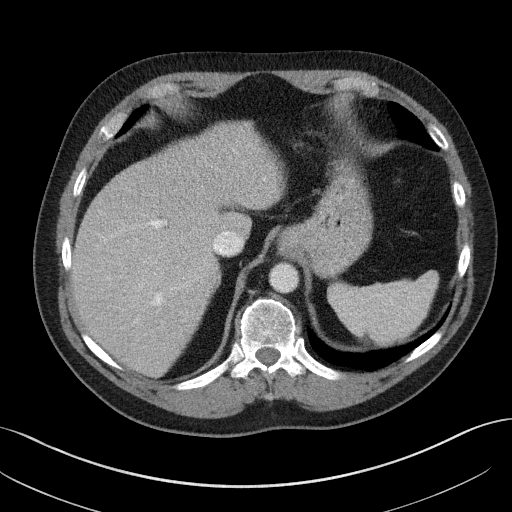
[im 85/101  lung]
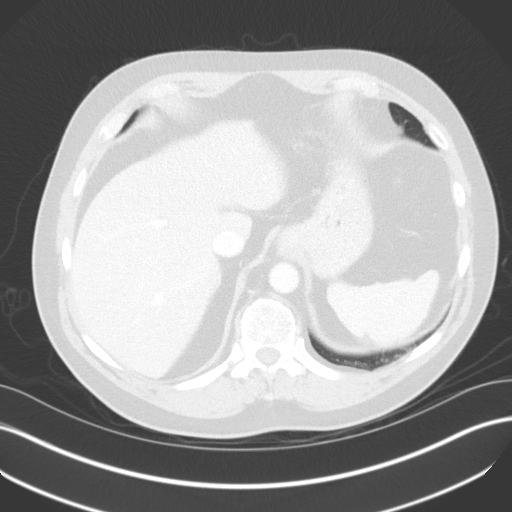
[im 90/101  lung]
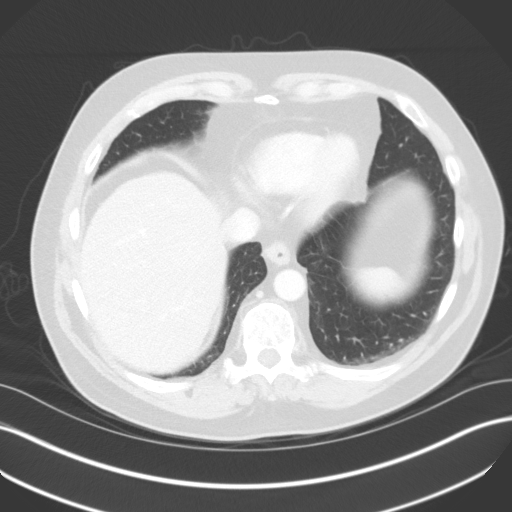
[im 95/101  soft-tissue]
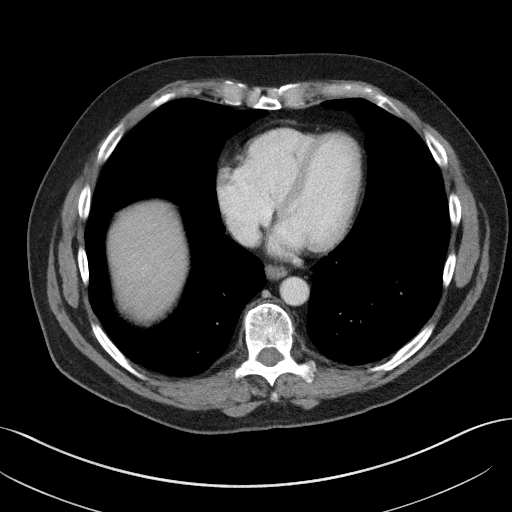
[im 95/101  lung]
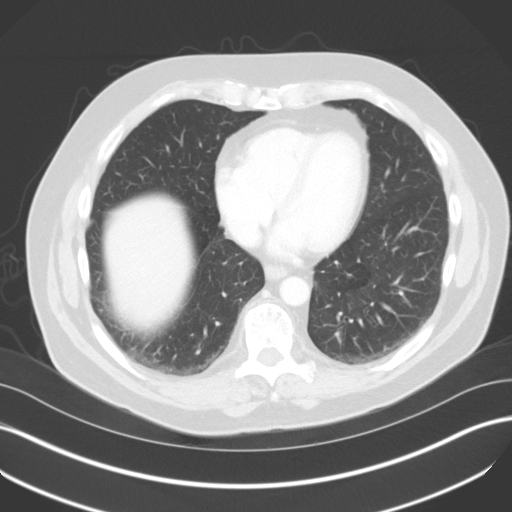

[14 of 32 positions shown; findings below may reference images not displayed]

FINDINGS: Lower chest: No acute abnormality.

Hepatobiliary: No focal liver abnormality is seen. No gallstones,
gallbladder wall thickening, or biliary dilatation.

Pancreas: Unremarkable. No pancreatic ductal dilatation or
surrounding inflammatory changes.

Spleen: Normal in size without focal abnormality.

Adrenals/Urinary Tract: Adrenal glands are unremarkable. Kidneys are
normal, without renal calculi, focal lesion, or hydronephrosis.
Bladder is unremarkable.

Stomach/Bowel: Stomach is within normal limits. Appendix appears
normal. No evidence of bowel wall thickening, distention, or
inflammatory changes.

Vascular/Lymphatic: Aortic atherosclerosis. No enlarged abdominal or
pelvic lymph nodes.

Reproductive: Prostate is unremarkable.

Other: No abdominal wall hernia or abnormality. No abdominopelvic
ascites.

Musculoskeletal: No acute or significant osseous findings.
IMPRESSION: Aortic atherosclerosis. No acute abnormality seen in the abdomen or
pelvis.

Aortic Atherosclerosis ([id number]).

## 2023-06-16 ENCOUNTER — Ambulatory Visit: Payer: Medicare HMO | Admitting: Anesthesiology

## 2023-06-16 ENCOUNTER — Encounter: Payer: Self-pay | Admitting: *Deleted

## 2023-06-16 ENCOUNTER — Encounter
Admission: RE | Disposition: A | Discharge status: Home / Self Care | Payer: Self-pay | Source: Home / Self Care | Attending: Gastroenterology

## 2023-06-16 ENCOUNTER — Ambulatory Visit
Admission: RE | Admit: 2023-06-16 | Discharge: 2023-06-16 | Disposition: A | Discharge status: Home / Self Care | Payer: Medicare HMO | Attending: Gastroenterology | Admitting: Gastroenterology

## 2023-06-16 DIAGNOSIS — Z860101 Personal history of adenomatous and serrated colon polyps: Secondary | ICD-10-CM | POA: Diagnosis not present

## 2023-06-16 DIAGNOSIS — Z7982 Long term (current) use of aspirin: Secondary | ICD-10-CM | POA: Diagnosis not present

## 2023-06-16 DIAGNOSIS — K449 Diaphragmatic hernia without obstruction or gangrene: Secondary | ICD-10-CM | POA: Diagnosis not present

## 2023-06-16 DIAGNOSIS — D123 Benign neoplasm of transverse colon: Secondary | ICD-10-CM | POA: Insufficient documentation

## 2023-06-16 DIAGNOSIS — I2510 Atherosclerotic heart disease of native coronary artery without angina pectoris: Secondary | ICD-10-CM | POA: Diagnosis not present

## 2023-06-16 DIAGNOSIS — K640 First degree hemorrhoids: Secondary | ICD-10-CM | POA: Insufficient documentation

## 2023-06-16 DIAGNOSIS — K2100 Gastro-esophageal reflux disease with esophagitis, without bleeding: Secondary | ICD-10-CM | POA: Diagnosis not present

## 2023-06-16 DIAGNOSIS — D120 Benign neoplasm of cecum: Secondary | ICD-10-CM | POA: Insufficient documentation

## 2023-06-16 DIAGNOSIS — Z794 Long term (current) use of insulin: Secondary | ICD-10-CM | POA: Insufficient documentation

## 2023-06-16 DIAGNOSIS — K2270 Barrett's esophagus without dysplasia: Secondary | ICD-10-CM | POA: Insufficient documentation

## 2023-06-16 DIAGNOSIS — K317 Polyp of stomach and duodenum: Secondary | ICD-10-CM | POA: Insufficient documentation

## 2023-06-16 DIAGNOSIS — Z1211 Encounter for screening for malignant neoplasm of colon: Secondary | ICD-10-CM | POA: Insufficient documentation

## 2023-06-16 DIAGNOSIS — I10 Essential (primary) hypertension: Secondary | ICD-10-CM | POA: Diagnosis not present

## 2023-06-16 DIAGNOSIS — E119 Type 2 diabetes mellitus without complications: Secondary | ICD-10-CM | POA: Insufficient documentation

## 2023-06-16 DIAGNOSIS — Z09 Encounter for follow-up examination after completed treatment for conditions other than malignant neoplasm: Secondary | ICD-10-CM | POA: Diagnosis not present

## 2023-06-16 HISTORY — PX: ESOPHAGOGASTRODUODENOSCOPY (EGD) WITH PROPOFOL: SHX5813

## 2023-06-16 HISTORY — DX: Acute myocardial infarction, unspecified: I21.9

## 2023-06-16 HISTORY — PX: POLYPECTOMY: SHX5525

## 2023-06-16 HISTORY — PX: COLONOSCOPY WITH PROPOFOL: SHX5780

## 2023-06-16 HISTORY — DX: Barrett's esophagus without dysplasia: K22.70

## 2023-06-16 SURGERY — COLONOSCOPY WITH PROPOFOL
Anesthesia: General

## 2023-06-16 MED ORDER — STERILE WATER FOR IRRIGATION IR SOLN
Status: DC | PRN
  Administered 2023-06-16: 60 mL

## 2023-06-16 MED ORDER — SODIUM CHLORIDE 0.9 % IV SOLN: INTRAVENOUS | Status: DC

## 2023-06-16 MED ORDER — LIDOCAINE HCL (PF) 2 % IJ SOLN
INTRAMUSCULAR | Status: AC
  Filled 2023-06-16: qty 5

## 2023-06-16 MED ORDER — DEXMEDETOMIDINE HCL IN NACL 200 MCG/50ML IV SOLN
INTRAVENOUS | Status: DC | PRN
  Administered 2023-06-16: 20 ug via INTRAVENOUS

## 2023-06-16 MED ORDER — PROPOFOL 1000 MG/100ML IV EMUL
INTRAVENOUS | Status: AC
  Filled 2023-06-16: qty 100

## 2023-06-16 MED ORDER — PROPOFOL 10 MG/ML IV BOLUS
INTRAVENOUS | Status: DC | PRN
  Administered 2023-06-16: 50 mg via INTRAVENOUS

## 2023-06-16 MED ORDER — PROPOFOL 500 MG/50ML IV EMUL
INTRAVENOUS | Status: DC | PRN
  Administered 2023-06-16: 75 ug/kg/min via INTRAVENOUS

## 2023-06-16 MED ORDER — EPHEDRINE 5 MG/ML INJ
INTRAVENOUS | Status: AC
  Filled 2023-06-16: qty 5

## 2023-06-16 MED ORDER — LIDOCAINE HCL (CARDIAC) PF 100 MG/5ML IV SOSY
PREFILLED_SYRINGE | INTRAVENOUS | Status: DC | PRN
  Administered 2023-06-16: 80 mg via INTRAVENOUS

## 2023-06-16 MED ORDER — DEXMEDETOMIDINE HCL IN NACL 80 MCG/20ML IV SOLN
INTRAVENOUS | Status: AC
  Filled 2023-06-16: qty 20

## 2023-06-16 MED ORDER — EPHEDRINE SULFATE-NACL 50-0.9 MG/10ML-% IV SOSY
PREFILLED_SYRINGE | INTRAVENOUS | Status: DC | PRN
  Administered 2023-06-16: 10 mg via INTRAVENOUS

## 2023-06-16 NOTE — Interval H&P Note (Signed)
History and Physical Interval Note:  06/16/2023 8:57 AM  Daniel Good  has presented today for surgery, with the diagnosis of Barretts esophagus.hx of adenomatous polyp of colon.  The various methods of treatment have been discussed with the patient and family. After consideration of risks, benefits and other options for treatment, the patient has consented to  Procedure(s): COLONOSCOPY WITH PROPOFOL (N/A) ESOPHAGOGASTRODUODENOSCOPY (EGD) WITH PROPOFOL (N/A) as a surgical intervention.  The patient's history has been reviewed, patient examined, no change in status, stable for surgery.  I have reviewed the patient's chart and labs.  Questions were answered to the patient's satisfaction.     Regis Bill  Ok to proceed with EGD/Colonoscopy

## 2023-06-16 NOTE — Transfer of Care (Signed)
Immediate Anesthesia Transfer of Care Note  Patient: Daniel Good  Procedure(s) Performed: COLONOSCOPY WITH PROPOFOL ESOPHAGOGASTRODUODENOSCOPY (EGD) WITH PROPOFOL  Patient Location: PACU  Anesthesia Type:General  Level of Consciousness: sedated  Airway & Oxygen Therapy: Patient Spontanous Breathing  Post-op Assessment: Report given to RN and Post -op Vital signs reviewed and stable  Post vital signs: Reviewed and stable  Last Vitals:  Vitals Value Taken Time  BP 124/69 06/16/23 0929  Temp 36.3 C 06/16/23 0929  Pulse 68 06/16/23 0930  Resp 12 06/16/23 0930  SpO2 97 % 06/16/23 0930  Vitals shown include unfiled device data.  Last Pain:  Vitals:   06/16/23 0929  TempSrc: Temporal  PainSc: 0-No pain         Complications: No notable events documented.

## 2023-06-16 NOTE — Op Note (Signed)
[facility] Gastroenterology Patient Name: Daniel Good Procedure Date: 06/16/2023 8:55 AM MRN: [id number] Account #: [id number] Date of Birth: 10-10-1945 Admit Type: Outpatient Age: 77 Room: [facility] ENDO ROOM 1 Gender: Male Note Status: Finalized Instrument Name: Upper Endoscope [id number] Procedure:             Upper GI endoscopy Indications:           Gastro-esophageal reflux disease, Follow-up of                         Barrett's esophagus Providers:             Eather Colas MD, MD Referring MD:          Danella Penton, MD (Referring MD) Medicines:             Monitored Anesthesia Care Complications:         No immediate complications. Procedure:             Pre-Anesthesia Assessment:                        - Prior to the procedure, a History and Physical was                         performed, and patient medications and allergies were                         reviewed. The patient is competent. The risks and                         benefits of the procedure and the sedation options and                         risks were discussed with the patient. All questions                         were answered and informed consent was obtained.                         Patient identification and proposed procedure were                         verified by the physician, the nurse, the                         anesthesiologist, the anesthetist and the technician                         in the endoscopy suite. Mental Status Examination:                         alert and oriented. Airway Examination: normal                         oropharyngeal airway and neck mobility. Respiratory                         Examination: clear to auscultation. CV Examination:  normal. Prophylactic Antibiotics: The patient does not                         require prophylactic antibiotics. Prior                         Anticoagulants: The patient has taken no anticoagulant                          or antiplatelet agents. ASA Grade Assessment: III - A                         patient with severe systemic disease. After reviewing                         the risks and benefits, the patient was deemed in                         satisfactory condition to undergo the procedure. The                         anesthesia plan was to use monitored anesthesia care                         (MAC). Immediately prior to administration of                         medications, the patient was re-assessed for adequacy                         to receive sedatives. The heart rate, respiratory                         rate, oxygen saturations, blood pressure, adequacy of                         pulmonary ventilation, and response to care were                         monitored throughout the procedure. The physical                         status of the patient was re-assessed after the                         procedure.                        After obtaining informed consent, the endoscope was                         passed under direct vision. Throughout the procedure,                         the patient's blood pressure, pulse, and oxygen                         saturations were monitored continuously. The  Endosonoscope was introduced through the mouth, and                         advanced to the second part of duodenum. The upper GI                         endoscopy was accomplished without difficulty. The                         patient tolerated the procedure well. Findings:      A small hiatal hernia was present.      The esophagus and gastroesophageal junction were examined with white       light and narrow band imaging (NBI). There was no visual evidence of       Barrett's esophagus.      Multiple 2 to 8 mm sessile fundic gland polyps with no bleeding and no       stigmata of recent bleeding were found in the gastric body.      The examined duodenum was  normal. Impression:            - Small hiatal hernia.                        - There is no endoscopic evidence of Barrett's                         esophagus.                        - Multiple fundic gland polyps.                        - Normal examined duodenum.                        - No specimens collected. Recommendation:        - Discharge patient to home.                        - Resume previous diet.                        - Continue present medications.                        - Return to referring physician as previously                         scheduled. Procedure Code(s):     --- Professional ---                        [phone], Esophagogastroduodenoscopy, flexible,                         transoral; diagnostic, including collection of                         specimen(s) by brushing or washing, when performed                         (separate procedure) Diagnosis Code(s):     ---  Professional ---                        K44.9, Diaphragmatic hernia without obstruction or                         gangrene                        K22.70, Barrett's esophagus without dysplasia                        K31.7, Polyp of stomach and duodenum                        K21.9, Gastro-esophageal reflux disease without                         esophagitis CPT copyright 2022 American [employer] Association. All rights reserved. The codes documented in this report are preliminary and upon coder review may  be revised to meet current compliance requirements. Eather Colas MD, MD 06/16/2023 9:33:11 AM Number of Addenda: 0 Note Initiated On: 06/16/2023 8:55 AM Estimated Blood Loss:  Estimated blood loss: none.      [facility]

## 2023-06-16 NOTE — H&P (Signed)
Outpatient short stay form Pre-procedure 06/16/2023  Regis Bill, MD  Primary Physician: Danella Penton, MD  Reason for visit:  Barrett's esophagus/Surveillance colonoscopy  History of present illness:    77 y/o gentleman with history of CAD, hypertension, DM II here for EGD for possible BE and colonoscopy for history of adenomatous polyps on last colonoscopy in 2019. No blood thinners except aspirin. No significant abdominal surgeries. No family history of GI malignancies.    Current Facility-Administered Medications:    0.9 %  sodium chloride infusion, , Intravenous, Continuous, Anjeanette Petzold, Rossie Muskrat, MD, Last Rate: 20 mL/hr at 06/16/23 0848, New Bag at 06/16/23 0848  Medications Prior to Admission  Medication Sig Dispense Refill Last Dose   acetaminophen (TYLENOL) 325 MG tablet Take 325-650 mg by mouth every 6 (six) hours as needed for mild pain (pain score 1-3).      aspirin EC 81 MG tablet Take 1 tablet by mouth daily.   Past Week   atorvastatin (LIPITOR) 80 MG tablet Take 1 tablet (80 mg total) by mouth daily at 6 PM. 1 tablet 0 Past Week   cyanocobalamin (VITAMIN B12) 1000 MCG tablet Take 1,000 mcg by mouth daily.   Past Week   famotidine (PEPCID) 40 MG tablet Take 40 mg by mouth at bedtime.      fluticasone (FLONASE) 50 MCG/ACT nasal spray Place 2 sprays into both nostrils daily.      lisinopril (PRINIVIL,ZESTRIL) 2.5 MG tablet Take 1 tablet by mouth daily.   Past Week   metoprolol succinate (TOPROL-XL) 25 MG 24 hr tablet Take 25 mg by mouth daily.   Past Week   pantoprazole (PROTONIX) 40 MG tablet Take 1 tablet (40 mg total) by mouth 2 (two) times daily. (Patient taking differently: Take 40 mg by mouth daily.) 1 tablet 0 Past Week   VITAMIN D PO Take 1 tablet by mouth daily. D3   Past Week   acetaminophen (TYLENOL) 650 MG suppository Place 1 suppository (650 mg total) rectally every 6 (six) hours as needed for mild pain (or Fever >/= 101). 12 suppository 0    heparin  100-0.45 UNIT/ML-% infusion Inject 1,100 Units/hr into the vein continuous. 250 mL 1    insulin aspart (NOVOLOG) 100 UNIT/ML injection Inject 0-5 Units into the skin at bedtime. 10 mL 11    insulin aspart (NOVOLOG) 100 UNIT/ML injection Inject 0-9 Units into the skin 3 (three) times daily with meals. 10 mL 11    KRILL OIL PO Take 1 capsule by mouth daily. (Patient not taking: Reported on 06/16/2023)   Not Taking   metoprolol succinate (TOPROL-XL) 50 MG 24 hr tablet Take 1 tablet by mouth daily.      nitroGLYCERIN (NITROSTAT) 0.4 MG SL tablet Place 1 tablet (0.4 mg total) under the tongue every 5 (five) minutes as needed for chest pain. 1 tablet 0    ondansetron (ZOFRAN) 4 MG/2ML SOLN injection Inject 2 mLs (4 mg total) into the vein every 6 (six) hours as needed for nausea. 2 mL 0    ranitidine (ZANTAC) 150 MG tablet Take 1 tablet by mouth daily.        Allergies  Allergen Reactions   Metformin And Related      Past Medical History:  Diagnosis Date   Barrett's esophagus    CAD (coronary artery disease)    Diabetes mellitus without complication (HCC)    GERD (gastroesophageal reflux disease)    Hyperlipidemia    Hypertension    Myocardial  infarction [facility])    Pre-diabetes     Review of systems:  Otherwise negative.    Physical Exam  Gen: Alert, oriented. Appears stated age.  HEENT: PERRLA. Lungs: No respiratory distress CV: RRR Abd: soft, benign, no masses Ext: No edema    Planned procedures: Proceed with EGD/colonoscopy. The patient understands the nature of the planned procedure, indications, risks, alternatives and potential complications including but not limited to bleeding, infection, perforation, damage to internal organs and possible oversedation/side effects from anesthesia. The patient agrees and gives consent to proceed.  Please refer to procedure notes for findings, recommendations and patient disposition/instructions.     Regis Bill, MD [facility]  Gastroenterology

## 2023-06-16 NOTE — Op Note (Signed)
[facility] Gastroenterology Patient Name: Daniel Good Procedure Date: 06/16/2023 8:54 AM MRN: [id number] Account #: [id number] Date of Birth: 1946/06/07 Admit Type: Outpatient Age: 77 Room: [facility] ENDO ROOM 1 Gender: Male Note Status: Finalized Instrument Name: Nelda Marseille [id number] Procedure:             Colonoscopy Indications:           Surveillance: Personal history of adenomatous polyps                         on last colonoscopy > 5 years ago Providers:             Eather Colas MD, MD Referring MD:          Danella Penton, MD (Referring MD) Medicines:             Monitored Anesthesia Care Complications:         No immediate complications. Estimated blood loss:                         Minimal. Procedure:             Pre-Anesthesia Assessment:                        - Prior to the procedure, a History and Physical was                         performed, and patient medications and allergies were                         reviewed. The patient is competent. The risks and                         benefits of the procedure and the sedation options and                         risks were discussed with the patient. All questions                         were answered and informed consent was obtained.                         Patient identification and proposed procedure were                         verified by the physician, the nurse, the                         anesthesiologist, the anesthetist and the technician                         in the endoscopy suite. Mental Status Examination:                         alert and oriented. Airway Examination: normal                         oropharyngeal airway and neck mobility. Respiratory  Examination: clear to auscultation. CV Examination:                         normal. Prophylactic Antibiotics: The patient does not                         require prophylactic antibiotics. Prior                          Anticoagulants: The patient has taken no anticoagulant                         or antiplatelet agents. ASA Grade Assessment: III - A                         patient with severe systemic disease. After reviewing                         the risks and benefits, the patient was deemed in                         satisfactory condition to undergo the procedure. The                         anesthesia plan was to use monitored anesthesia care                         (MAC). Immediately prior to administration of                         medications, the patient was re-assessed for adequacy                         to receive sedatives. The heart rate, respiratory                         rate, oxygen saturations, blood pressure, adequacy of                         pulmonary ventilation, and response to care were                         monitored throughout the procedure. The physical                         status of the patient was re-assessed after the                         procedure.                        After obtaining informed consent, the colonoscope was                         passed under direct vision. Throughout the procedure,                         the patient's blood pressure, pulse, and oxygen  saturations were monitored continuously. The                         Colonoscope was introduced through the anus and                         advanced to the the cecum, identified by appendiceal                         orifice and ileocecal valve. The colonoscopy was                         performed without difficulty. The patient tolerated                         the procedure well. The quality of the bowel                         preparation was good. The ileocecal valve, appendiceal                         orifice, and rectum were photographed. Findings:      The perianal and digital rectal examinations were normal.      A 2 mm polyp was found in the cecum. The  polyp was sessile. The polyp       was removed with a cold snare. Resection and retrieval were complete.       Estimated blood loss was minimal.      Two sessile polyps were found in the transverse colon. The polyps were 2       to 3 mm in size. These polyps were removed with a cold snare. Resection       and retrieval were complete. Estimated blood loss was minimal.      Internal hemorrhoids were found during retroflexion. The hemorrhoids       were Grade I (internal hemorrhoids that do not prolapse).      The exam was otherwise without abnormality on direct and retroflexion       views. Impression:            - One 2 mm polyp in the cecum, removed with a cold                         snare. Resected and retrieved.                        - Two 2 to 3 mm polyps in the transverse colon,                         removed with a cold snare. Resected and retrieved.                        - Internal hemorrhoids.                        - The examination was otherwise normal on direct and                         retroflexion views. Recommendation:        - Discharge patient  to home.                        - Resume previous diet.                        - Continue present medications.                        - Await pathology results.                        - Repeat colonoscopy is not recommended due to current                         age (43 years or older) for surveillance.                        - Return to referring physician as previously                         scheduled. Procedure Code(s):     --- Professional ---                        [phone], Colonoscopy, flexible; with removal of                         tumor(s), polyp(s), or other lesion(s) by snare                         technique Diagnosis Code(s):     --- Professional ---                        Z86.010, Personal history of colonic polyps                        D12.0, Benign neoplasm of cecum                        D12.3, Benign  neoplasm of transverse colon (hepatic                         flexure or splenic flexure)                        K64.0, First degree hemorrhoids CPT copyright 2022 American Medical Association. All rights reserved. The codes documented in this report are preliminary and upon coder review may  be revised to meet current compliance requirements. Eather Colas MD, MD 06/16/2023 9:35:48 AM Number of Addenda: 0 Note Initiated On: 06/16/2023 8:54 AM Scope Withdrawal Time: 0 hours 8 minutes 27 seconds  Total Procedure Duration: 0 hours 13 minutes 33 seconds  Estimated Blood Loss:  Estimated blood loss was minimal.      [facility]

## 2023-06-16 NOTE — Anesthesia Postprocedure Evaluation (Signed)
Anesthesia Post Note  Patient: Daniel Good  Procedure(s) Performed: COLONOSCOPY WITH PROPOFOL ESOPHAGOGASTRODUODENOSCOPY (EGD) WITH PROPOFOL  Patient location during evaluation: PACU Anesthesia Type: General Level of consciousness: awake and alert Pain management: pain level controlled Vital Signs Assessment: post-procedure vital signs reviewed and stable Respiratory status: spontaneous breathing, nonlabored ventilation, respiratory function stable and patient connected to nasal cannula oxygen Cardiovascular status: blood pressure returned to baseline and stable Postop Assessment: no apparent nausea or vomiting Anesthetic complications: no   No notable events documented.   Last Vitals:  Vitals:   06/16/23 0840 06/16/23 0929  BP: (!) 148/83 124/69  Pulse: 62 66  Resp: 18 12  Temp: (!) 35.9 C (!) 36.3 C  SpO2: 98% 97%    Last Pain:  Vitals:   06/16/23 0939  TempSrc:   PainSc: 0-No pain                 Yevette Edwards

## 2023-06-16 NOTE — Anesthesia Preprocedure Evaluation (Signed)
Anesthesia Evaluation  Patient identified by MRN, date of birth, ID band Patient awake    Reviewed: Allergy & Precautions, H&P , NPO status , Patient's Chart, lab work & pertinent test results, reviewed documented beta blocker date and time   Airway Mallampati: II   Neck ROM: full    Dental  (+) Poor Dentition   Pulmonary neg pulmonary ROS, former smoker   Pulmonary exam normal        Cardiovascular Exercise Tolerance: Good hypertension, On Medications + CAD and + Past MI  Normal cardiovascular exam Rhythm:regular Rate:Normal     Neuro/Psych negative neurological ROS  negative psych ROS   GI/Hepatic Neg liver ROS,GERD  Medicated,,  Endo/Other  negative endocrine ROSdiabetes    Renal/GU negative Renal ROS  negative genitourinary   Musculoskeletal   Abdominal   Peds  Hematology negative hematology ROS (+)   Anesthesia Other Findings Past Medical History: No date: Barrett's esophagus No date: CAD (coronary artery disease) No date: Diabetes mellitus without complication (HCC) No date: GERD (gastroesophageal reflux disease) No date: Hyperlipidemia No date: Hypertension No date: Myocardial infarction (HCC) No date: Pre-diabetes Past Surgical History: No date: COLONOSCOPY No date: ESOPHAGOGASTRODUODENOSCOPY 12/22/2017: LEFT HEART CATH AND CORONARY ANGIOGRAPHY; N/A     Comment:  Procedure: LEFT HEART CATH AND CORONARY ANGIOGRAPHY and               possible PCI;  Surgeon: Alwyn Pea, MD;                Location: [facility] INVASIVE CV LAB;  Service: Cardiovascular;              Laterality: N/A; BMI    Body Mass Index: 29.56 kg/m     Reproductive/Obstetrics negative OB ROS                             Anesthesia Physical Anesthesia Plan  ASA: 3  Anesthesia Plan: General   Post-op Pain Management:    Induction:   PONV Risk Score and Plan:   Airway Management Planned:    Additional Equipment:   Intra-op Plan:   Post-operative Plan:   Informed Consent: I have reviewed the patients History and Physical, chart, labs and discussed the procedure including the risks, benefits and alternatives for the proposed anesthesia with the patient or authorized representative who has indicated his/her understanding and acceptance.     Dental Advisory Given  Plan Discussed with: CRNA  Anesthesia Plan Comments:        Anesthesia Quick Evaluation

## 2023-06-17 ENCOUNTER — Encounter: Payer: Self-pay | Admitting: Gastroenterology

## 2023-06-19 LAB — SURGICAL PATHOLOGY
